# Patient Record
Sex: Female | Born: 1995 | Race: White | Hispanic: No | Marital: Single | Smoking: Never smoker
Health system: Southern US, Community
[De-identification: ages and names within clinical notes are randomized; demographics above are authoritative.]

## PROBLEM LIST (undated history)

## (undated) ENCOUNTER — Inpatient Hospital Stay: Payer: Self-pay

## (undated) DIAGNOSIS — N915 Oligomenorrhea, unspecified: Secondary | ICD-10-CM

## (undated) DIAGNOSIS — L709 Acne, unspecified: Secondary | ICD-10-CM

## (undated) HISTORY — DX: Acne, unspecified: L70.9

## (undated) HISTORY — PX: NO PAST SURGERIES: SHX2092

## (undated) HISTORY — DX: Oligomenorrhea, unspecified: N91.5

---
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
## 2017-03-07 ENCOUNTER — Emergency Department
Admission: EM | Admit: 2017-03-07 | Discharge: 2017-03-07 | Disposition: A | Discharge status: Home / Self Care | Payer: BLUE CROSS/BLUE SHIELD | Attending: Emergency Medicine | Admitting: Emergency Medicine

## 2017-03-07 ENCOUNTER — Encounter: Payer: Self-pay | Admitting: *Deleted

## 2017-03-07 DIAGNOSIS — R197 Diarrhea, unspecified: Secondary | ICD-10-CM

## 2017-03-07 DIAGNOSIS — R112 Nausea with vomiting, unspecified: Secondary | ICD-10-CM

## 2017-03-07 DIAGNOSIS — K529 Noninfective gastroenteritis and colitis, unspecified: Secondary | ICD-10-CM

## 2017-03-07 DIAGNOSIS — E860 Dehydration: Secondary | ICD-10-CM

## 2017-03-07 LAB — COMPREHENSIVE METABOLIC PANEL
ALBUMIN: 4.2 g/dL (ref 3.5–5.0)
ALT: 15 U/L (ref 14–54)
ANION GAP: 8 (ref 5–15)
AST: 23 U/L (ref 15–41)
Alkaline Phosphatase: 46 U/L (ref 38–126)
BILIRUBIN TOTAL: 0.9 mg/dL (ref 0.3–1.2)
BUN: 11 mg/dL (ref 6–20)
CALCIUM: 8.9 mg/dL (ref 8.9–10.3)
CO2: 24 mmol/L (ref 22–32)
CREATININE: 1.18 mg/dL — AB (ref 0.44–1.00)
Chloride: 107 mmol/L (ref 101–111)
GFR calc Af Amer: >60 mL/min (ref >60)
GFR calc non Af Amer: >60 mL/min (ref >60)
GLUCOSE: 116 mg/dL — AB (ref 65–99)
Potassium: 3.5 mmol/L (ref 3.5–5.1)
Sodium: 139 mmol/L (ref 135–145)
TOTAL PROTEIN: 7.3 g/dL (ref 6.5–8.1)

## 2017-03-07 LAB — LIPASE, BLOOD: Lipase: 12 U/L (ref 11–51)

## 2017-03-07 LAB — CBC
HCT: 41.3 % (ref 35.0–47.0)
HEMOGLOBIN: 14.1 g/dL (ref 12.0–16.0)
MCH: 29.6 pg (ref 26.0–34.0)
MCHC: 34.1 g/dL (ref 32.0–36.0)
MCV: 87 fL (ref 80.0–100.0)
PLATELETS: 88 10*3/uL — AB (ref 150–440)
RBC: 4.75 MIL/uL (ref 3.80–5.20)
RDW: 12.2 % (ref 11.5–14.5)
WBC: 7.8 10*3/uL (ref 3.6–11.0)

## 2017-03-07 LAB — URINALYSIS, COMPLETE (UACMP) WITH MICROSCOPIC
BILIRUBIN URINE: NEGATIVE
Glucose, UA: NEGATIVE mg/dL
Ketones, ur: NEGATIVE mg/dL
LEUKOCYTES UA: NEGATIVE
NITRITE: NEGATIVE
PH: 5 (ref 5.0–8.0)
Protein, ur: 100 mg/dL — AB
Specific Gravity, Urine: 1.01 (ref 1.005–1.030)

## 2017-03-07 LAB — POCT PREGNANCY, URINE: Preg Test, Ur: NEGATIVE

## 2017-03-07 MED ORDER — ONDANSETRON 4 MG PO TBDP: 4.0000 mg | ORAL_TABLET | Freq: Three times a day (TID) | ORAL | 0 refills | Status: DC | PRN

## 2017-03-07 MED ORDER — ONDANSETRON 4 MG PO TBDP
8.0000 mg | ORAL_TABLET | Freq: Once | ORAL | Status: AC
  Administered 2017-03-07: 8 mg via ORAL
  Filled 2017-03-07: qty 2

## 2017-03-07 MED ORDER — ONDANSETRON HCL 4 MG/2ML IJ SOLN
INTRAMUSCULAR | Status: AC
  Administered 2017-03-07: 4 mg via INTRAVENOUS
  Filled 2017-03-07: qty 2

## 2017-03-07 MED ORDER — FAMOTIDINE 20 MG PO TABS
20.0000 mg | ORAL_TABLET | Freq: Two times a day (BID) | ORAL | 0 refills | Status: DC
Start: 2017-03-07 — End: 2017-04-06

## 2017-03-07 MED ORDER — DEXTROSE 5 % IV BOLUS
1000.0000 mL | Freq: Once | INTRAVENOUS | Status: AC
  Administered 2017-03-07: 1000 mL via INTRAVENOUS
  Filled 2017-03-07: qty 1000

## 2017-03-07 MED ORDER — ONDANSETRON HCL 4 MG/2ML IJ SOLN
4.0000 mg | Freq: Once | INTRAMUSCULAR | Status: AC
  Administered 2017-03-07: 4 mg via INTRAVENOUS
  Filled 2017-03-07: qty 2

## 2017-03-07 MED ORDER — LOPERAMIDE HCL 2 MG PO TABS: 4.0000 mg | ORAL_TABLET | Freq: Four times a day (QID) | ORAL | 0 refills | Status: DC | PRN

## 2017-03-07 MED ORDER — LOPERAMIDE HCL 2 MG PO CAPS
4.0000 mg | ORAL_CAPSULE | ORAL | Status: DC | PRN
  Filled 2017-03-07: qty 2

## 2017-03-07 MED ORDER — DEXTROSE 5 % AND 0.9 % NACL IV BOLUS
1000.0000 mL | Freq: Once | INTRAVENOUS | Status: DC
  Filled 2017-03-07: qty 1000

## 2017-03-07 MED ORDER — FAMOTIDINE 20 MG PO TABS
40.0000 mg | ORAL_TABLET | Freq: Once | ORAL | Status: AC
  Administered 2017-03-07: 40 mg via ORAL
  Filled 2017-03-07: qty 2

## 2017-03-07 MED ORDER — ONDANSETRON HCL 4 MG/2ML IJ SOLN
4.0000 mg | Freq: Once | INTRAMUSCULAR | Status: AC
  Administered 2017-03-07: 4 mg via INTRAVENOUS

## 2017-03-07 NOTE — ED Notes (Signed)
Patient with additional episodes of vomiting. MD made aware. See MAR for new orders.

## 2017-03-07 NOTE — ED Provider Notes (Signed)
[facility] Emergency Department Provider Note [facility] ____________________________________________  Time seen: Approximately 3:52 PM  I have reviewed the triage vital signs and the nursing notes.   HISTORY  Chief Complaint Emesis    HPI Stephanie Walker is a 21 y.o. female who complains of nausea vomiting and diarrhea for the past 2 days. Difficulty tolerating anything by mouth without vomiting afterward. Denies dizziness or syncope. No blood in her vomiting or stool. Denies abdominal pain.     History reviewed. No pertinent past medical history.   There are no active problems to display for this patient.    History reviewed. No pertinent surgical history.   Prior to Admission medications   Medication Sig Start Date End Date Taking? Authorizing Provider  famotidine (PEPCID) 20 MG tablet Take 1 tablet (20 mg total) by mouth 2 (two) times daily. 03/07/17   Sharman CheekPhillip Jaydenn Boccio, MD  loperamide (IMODIUM A-D) 2 MG tablet Take 2 tablets (4 mg total) by mouth 4 (four) times daily as needed for diarrhea or loose stools. 03/07/17   Sharman CheekPhillip Lillith Mcneff, MD  ondansetron (ZOFRAN ODT) 4 MG disintegrating tablet Take 1 tablet (4 mg total) by mouth every 8 (eight) hours as needed for nausea or vomiting. 03/07/17   Sharman CheekPhillip Leyla Soliz, MD     Allergies Patient has no known allergies.   History reviewed. No pertinent family history.  Social History Social History  Substance Use Topics  . Smoking status: Never Smoker  . Smokeless tobacco: Never Used  . Alcohol use Yes    Review of Systems  Constitutional:   No fever or chills.  ENT:   No sore throat. No rhinorrhea. Cardiovascular:   No chest pain. Respiratory:   No dyspnea or cough. Gastrointestinal:   Positive generalized abdominal pain, mild, with vomiting and diarrhea..  Genitourinary:   Negative for dysuria or difficulty urinating. Musculoskeletal:   Negative for focal pain or swelling Neurological:   Negative for  headaches 10-point ROS otherwise negative.  ____________________________________________   PHYSICAL EXAM:  VITAL SIGNS: ED Triage Vitals  Enc Vitals Group     BP 03/07/17 0904 126/73     Pulse Rate 03/07/17 0904 99     Resp 03/07/17 0904 18     Temp 03/07/17 0904 98.6 F (37 C)     Temp Source 03/07/17 0904 Oral     SpO2 03/07/17 0904 100 %     Weight 03/07/17 0905 132 lb (59.9 kg)     Height 03/07/17 0905 5\' 1"  (1.549 m)     Head Circumference --      Peak Flow --      Pain Score 03/07/17 0904 9     Pain Loc --      Pain Edu? --      Excl. in GC? --     Vital signs reviewed, nursing assessments reviewed.   Constitutional:   Alert and oriented. Well appearing and in no distress. Eyes:   No scleral icterus. No conjunctival pallor. PERRL. EOMI.  No nystagmus. ENT   Head:   Normocephalic and atraumatic.   Nose:   No congestion/rhinnorhea. No septal hematoma   Mouth/Throat:   MMM, Mild pharyngeal erythema. No peritonsillar mass.    Neck:   No stridor. No SubQ emphysema. No meningismus. Hematological/Lymphatic/Immunilogical:   No cervical lymphadenopathy. Cardiovascular:   RRR. Symmetric bilateral radial and DP pulses.  No murmurs.  Respiratory:   Normal respiratory effort without tachypnea nor retractions. Breath sounds are clear and equal bilaterally. No  wheezes/rales/rhonchi. Gastrointestinal:   Soft and nontender. Non distended. There is no CVA tenderness.  No rebound, rigidity, or guarding. Genitourinary:   deferred Musculoskeletal:   Normal range of motion in all extremities. No joint effusions.  No lower extremity tenderness.  No edema. Neurologic:   Normal speech and language.  CN 2-10 normal. Motor grossly intact. No gross focal neurologic deficits are appreciated.  Skin:    Skin is warm, dry and intact. No rash noted.  No petechiae, purpura, or bullae.  ____________________________________________    LABS (pertinent positives/negatives) (all  labs ordered are listed, but only abnormal results are displayed) Labs Reviewed  COMPREHENSIVE METABOLIC PANEL - Abnormal; Notable for the following:       Result Value   Glucose, Bld 116 (*)    Creatinine, Ser 1.18 (*)    All other components within normal limits  CBC - Abnormal; Notable for the following:    Platelets 88 (*)    All other components within normal limits  URINALYSIS, COMPLETE (UACMP) WITH MICROSCOPIC - Abnormal; Notable for the following:    Color, Urine YELLOW (*)    APPearance HAZY (*)    Hgb urine dipstick SMALL (*)    Protein, ur 100 (*)    Bacteria, UA FEW (*)    Squamous Epithelial / LPF 0-5 (*)    All other components within normal limits  LIPASE, BLOOD  POC URINE PREG, ED  POCT PREGNANCY, URINE   ____________________________________________   EKG    ____________________________________________    RADIOLOGY  No results found.  ____________________________________________   PROCEDURES Procedures  ____________________________________________   INITIAL IMPRESSION / ASSESSMENT AND PLAN / ED COURSE  Pertinent labs & imaging results that were available during my care of the patient were reviewed by me and considered in my medical decision making (see chart for details).  Patient well appearing no acute distress. Given oral medications, but still having difficulty tolerating oral intake. Was given IV D5 bolus, feeling better. Tolerating oral intake. We'll discharge home with oral medications for symptom relief. consistent with viral gastroenteritis.Considering the patient's symptoms, medical history, and physical examination today, I have low suspicion for cholecystitis or biliary pathology, pancreatitis, perforation or bowel obstruction, hernia, intra-abdominal abscess, AAA or dissection, volvulus or intussusception, mesenteric ischemia, or appendicitis.            ____________________________________________   FINAL CLINICAL  IMPRESSION(S) / ED DIAGNOSES  Final diagnoses:  Nausea vomiting and diarrhea  Gastroenteritis  Dehydration      New Prescriptions   FAMOTIDINE (PEPCID) 20 MG TABLET    Take 1 tablet (20 mg total) by mouth 2 (two) times daily.   LOPERAMIDE (IMODIUM A-D) 2 MG TABLET    Take 2 tablets (4 mg total) by mouth 4 (four) times daily as needed for diarrhea or loose stools.   ONDANSETRON (ZOFRAN ODT) 4 MG DISINTEGRATING TABLET    Take 1 tablet (4 mg total) by mouth every 8 (eight) hours as needed for nausea or vomiting.     Portions of this note were generated with dragon dictation software. Dictation errors may occur despite best attempts at proofreading.    Sharman Cheek, MD 03/07/17 [phone]

## 2017-03-07 NOTE — ED Triage Notes (Signed)
States she has been vomiting and unable to keep anything down for 36 hours, states abd cramping, pt awake and alert in no acute distress

## 2017-03-07 NOTE — ED Notes (Signed)
Patient is well looking patient, talking and laughing with visitor.  Patient states, "I've been throwing up for the past 36 hours straight."  Patient reports attempting to eat chicken alfredo last night but threw up shortly after.  Patient reports cold sympoms and states, "It's like I"m throwing up green mucous and then that makes me throw up more."  Patient is very talkative.  Denies abdominal pain and diarrhea.  Denies history of similar symptoms.  Patient reports that drinking water at 3am also caused her to have more vomiting episodes.

## 2017-03-09 ENCOUNTER — Ambulatory Visit (INDEPENDENT_AMBULATORY_CARE_PROVIDER_SITE_OTHER): Payer: BLUE CROSS/BLUE SHIELD

## 2017-03-09 ENCOUNTER — Encounter: Payer: Self-pay | Admitting: *Deleted

## 2017-03-09 ENCOUNTER — Ambulatory Visit
Admission: EM | Admit: 2017-03-09 | Discharge: 2017-03-09 | Disposition: A | Discharge status: Home / Self Care | Payer: BLUE CROSS/BLUE SHIELD | Attending: Family Medicine | Admitting: Family Medicine

## 2017-03-09 DIAGNOSIS — R109 Unspecified abdominal pain: Secondary | ICD-10-CM | POA: Diagnosis not present

## 2017-03-09 DIAGNOSIS — R110 Nausea: Secondary | ICD-10-CM | POA: Diagnosis not present

## 2017-03-09 DIAGNOSIS — R112 Nausea with vomiting, unspecified: Secondary | ICD-10-CM

## 2017-03-09 DIAGNOSIS — R509 Fever, unspecified: Secondary | ICD-10-CM | POA: Diagnosis not present

## 2017-03-09 MED ORDER — PROMETHAZINE HCL 25 MG PO TABS: 25.0000 mg | ORAL_TABLET | Freq: Three times a day (TID) | ORAL | 0 refills | Status: DC | PRN

## 2017-03-09 MED ORDER — PROMETHAZINE HCL 25 MG/ML IJ SOLN
25.0000 mg | Freq: Once | INTRAMUSCULAR | Status: AC
  Administered 2017-03-09: 25 mg via INTRAMUSCULAR

## 2017-03-09 NOTE — ED Triage Notes (Signed)
Patient continues have symptoms of nausea and vomiting, abdominal pain, and fever since her visit to the ED on 03/07/17.

## 2017-03-09 NOTE — ED Provider Notes (Signed)
CSN: [id number]     Arrival date & time 03/09/17  1811 History   First MD Initiated Contact with Patient 03/09/17 1904     Chief Complaint  Patient presents with  . Abdominal Pain  . Fever  . Nausea   (Consider location/radiation/quality/duration/timing/severity/associated sxs/prior Treatment) HPI  A 21 year old female who presents with nausea vomiting abdominal pain but no  diarrhea. Her temperature of 99.2. He states that she is able to tolerate liquids but is not able to tolerate any solids. Last BM was normal which happened approximately 2 hours ago. Vomiting at this point is mostly dry heaving. Has no other complaints. Her abdominal pain is very diffuse. She has a negative pregnancy test. I reviewed her  previous laboratories and they all appear normal execpt  her platelets 88 and creatinine  of 1.18.She did not have any imaging at the visit to emergency department 2 days ago. She is in no acute distress does not appear toxic. She states she is very hungry       History reviewed. No pertinent past medical history. History reviewed. No pertinent surgical history. History reviewed. No pertinent family history. Social History  Substance Use Topics  . Smoking status: Never Smoker  . Smokeless tobacco: Never Used  . Alcohol use Yes   OB History    No data available     Review of Systems  Constitutional: Positive for activity change and appetite change. Negative for chills, fatigue and fever.  Gastrointestinal: Positive for abdominal pain, nausea and vomiting.  All other systems reviewed and are negative.   Allergies  Patient has no known allergies.  Home Medications   Prior to Admission medications   Medication Sig Start Date End Date Taking? Authorizing Provider  famotidine (PEPCID) 20 MG tablet Take 1 tablet (20 mg total) by mouth 2 (two) times daily. 03/07/17  Yes Sharman CheekPhillip Stafford, MD  loperamide (IMODIUM A-D) 2 MG tablet Take 2 tablets (4 mg total) by mouth 4 (four)  times daily as needed for diarrhea or loose stools. 03/07/17  Yes Sharman CheekPhillip Stafford, MD  norethindrone-ethinyl estradiol-iron (ESTROSTEP FE,TILIA FE,TRI-LEGEST FE) 1-20/1-30/1-35 MG-MCG tablet Take 1 tablet by mouth daily.   Yes Historical Provider, MD  ondansetron (ZOFRAN ODT) 4 MG disintegrating tablet Take 1 tablet (4 mg total) by mouth every 8 (eight) hours as needed for nausea or vomiting. 03/07/17  Yes Sharman CheekPhillip Stafford, MD  promethazine (PHENERGAN) 25 MG tablet Take 1 tablet (25 mg total) by mouth every 8 (eight) hours as needed for nausea or vomiting. 03/09/17   Lutricia FeilWilliam P Roemer, PA-C   Meds Ordered and Administered this Visit   Medications  promethazine (PHENERGAN) injection 25 mg (25 mg Intramuscular Given 03/09/17 2041)    BP 127/80 (BP Location: Left Arm)   Pulse 83   Temp 99.2 F (37.3 C) (Oral)   Resp 16   LMP 02/21/2017 (Approximate)   SpO2 100%  No data found.   Physical Exam  Constitutional: She is oriented to person, place, and time. She appears well-developed and well-nourished. No distress.  HENT:  Head: Normocephalic and atraumatic.  Eyes: Pupils are equal, round, and reactive to light. Right eye exhibits no discharge. Left eye exhibits no discharge.  Neck: Normal range of motion. Neck supple.  Pulmonary/Chest: Effort normal and breath sounds normal.  Abdominal: Soft. Bowel sounds are normal. She exhibits no distension and no mass. There is tenderness. There is no rebound and no guarding.  Abdominal pain is diffuse appears to be more prevalent on  the left lower quadrant. She is a negative Murphy sign.  Musculoskeletal: Normal range of motion.  Neurological: She is alert and oriented to person, place, and time.  Skin: Skin is warm and dry. She is not diaphoretic.  Psychiatric: She has a normal mood and affect. Her behavior is normal. Judgment and thought content normal.  Nursing note and vitals reviewed.   Urgent Care Course     Procedures (including critical  care time)  Labs Review Labs Reviewed - No data to display  Imaging Review Dg Abd Acute W/chest  Result Date: 03/09/2017 CLINICAL DATA:  Acute onset of left lower quadrant abdominal pain, nausea, vomiting and fever. Initial encounter. EXAM: DG ABDOMEN ACUTE W/ 1V CHEST COMPARISON:  None. FINDINGS: The lungs are well-aerated and clear. There is no evidence of focal opacification, pleural effusion or pneumothorax. The cardiomediastinal silhouette is within normal limits. The visualized bowel gas pattern is unremarkable. Scattered stool and air are seen within the colon; there is no evidence of small bowel dilatation to suggest obstruction. No free intra-abdominal air is identified on the provided upright view. No acute osseous abnormalities are seen; the sacroiliac joints are unremarkable in appearance. IMPRESSION: 1. Unremarkable bowel gas pattern; no free intra-abdominal air seen. Small amount of stool noted in the colon. 2. No acute cardiopulmonary process seen. Electronically Signed   By: Roanna Raider M.D.   On: 03/09/2017 20:13     Visual Acuity Review  Right Eye Distance:   Left Eye Distance:   Bilateral Distance:    Right Eye Near:   Left Eye Near:    Bilateral Near:      Medications  promethazine (PHENERGAN) injection 25 mg (25 mg Intramuscular Given 03/09/17 2041)     MDM   1. Nausea   2. Non-intractable vomiting with nausea, unspecified vomiting type    New Prescriptions   PROMETHAZINE (PHENERGAN) 25 MG TABLET    Take 1 tablet (25 mg total) by mouth every 8 (eight) hours as needed for nausea or vomiting.  Plan: 1. Test/x-ray results and diagnosis reviewed with patient 2. rx as per orders; risks, benefits, potential side effects reviewed with patient 3. Recommend supportive treatment with Fluid replacement. Try Phenergan for better nausea control since Zofran does not seem to be helping. She is to follow-up with her primary care physician may require additional imaging  either ultrasound and/or CAT scan. She worsens overnight she should go back to the emergency room. Hopefully this will eventually wane;Stop The nausea vomiting and  improve. If not, she will need further evaluation and treatment 4. F/u prn if symptoms worsen or don't improve     Lutricia Feil, PA-C 03/09/17 2044    William P [place], [place] 03/09/17 2045

## 2017-04-06 ENCOUNTER — Encounter: Payer: Self-pay | Admitting: Certified Nurse Midwife

## 2017-04-06 ENCOUNTER — Ambulatory Visit (INDEPENDENT_AMBULATORY_CARE_PROVIDER_SITE_OTHER): Payer: BLUE CROSS/BLUE SHIELD | Admitting: Certified Nurse Midwife

## 2017-04-06 VITALS — BP 100/60 | HR 90 | Ht 62.0 in | Wt 130.0 lb

## 2017-04-06 DIAGNOSIS — Z308 Encounter for other contraceptive management: Secondary | ICD-10-CM | POA: Diagnosis not present

## 2017-04-09 NOTE — Progress Notes (Signed)
   Obstetrics & Gynecology Office Visit   Chief Complaint:  Wants to discuss changing birth control.  History of Present Illness: 21 year old G0 who started taking Sprintec birth control pills last June after her Nexplanon was removed. She is having a hard time remembering to take her pills and they also make her nauseous at times.  She also has a history of acne and oligomenorrhea. She does not smoke. She has no chronic medical illnesses. Her LMP was 01/12/2017   Review of Systems:  ROS   Past Medical History:  Past Medical History:  Diagnosis Date  . Acne   . Oligomenorrhea     Past Surgical History:  Past Surgical History:  Procedure Laterality Date  . NO PAST SURGERIES      Gynecologic History: Patient's last menstrual period was 01/12/2017 (approximate).  Obstetric History: G0P0000  Family History:  Family History  Problem Relation Age of Onset  . Hypertension Mother     Social History:  Social History   Social History  . Marital status: Single    Spouse name: N/A  . Number of children: N/A  . Years of education: N/A   Occupational History  . Not on file.   Social History Main Topics  . Smoking status: Never Smoker  . Smokeless tobacco: Never Used  . Alcohol use Yes  . Drug use: No  . Sexual activity: Yes    Birth control/ protection: Pill   Other Topics Concern  . Not on file   Social History Narrative  . No narrative on file    Allergies:  No Known Allergies  Medications: Prior to Admission medications   Medication Sig Start Date End Date Taking? Authorizing Provider  SPRINTEC 28 0.25-35 MG-MCG tablet Take 1 tablet by mouth daily. 01/27/17  Yes Historical Provider, MD    Physical Exam Vitals: BP 100/60   Pulse 90   Ht  (1.575 m)   Wt 59 kg (130 lb)   LMP 01/12/2017 (Approximate) Comment: continuos dose OCP  BMI 23.78 kg/m  Patient's last menstrual period was 01/12/2017 (approximate).  Physical Exam  Constitutional: She is  oriented to person, place, and time. She appears well-developed and well-nourished. No distress.  HENT:  Head: Normocephalic and atraumatic.  Neurological: She is alert and oriented to person, place, and time.  Skin: Skin is warm and dry.  Psychiatric: She has a normal mood and affect. Her behavior is normal. Thought content normal.     Assessment: 21 y.o. G0P0000 for birth control conference  Plan: Problem List Items Addressed This Visit    None    Visit Diagnoses    Encounter for other contraceptive management    -  Primary     Discussed all contraceptive options and reviewed pros and cons of each. Desires to try Nuvaring. Discussed how to insert/use possible side effects, 3 hour rule, and risks of thromboembolism as with birth control pills. Sample given as well as RX.Follow up at time of annual in June/July or sooner prn problems.  Farrel Conners, CNM

## 2017-04-10 MED ORDER — ETONOGESTREL-ETHINYL ESTRADIOL 0.12-0.015 MG/24HR VA RING: VAGINAL_RING | VAGINAL | 3 refills | Status: DC

## 2017-04-17 ENCOUNTER — Other Ambulatory Visit: Payer: Self-pay | Admitting: Certified Nurse Midwife

## 2017-08-22 NOTE — Progress Notes (Deleted)
Gynecology Annual Exam  PCP: Mickey Farber, MD  Chief Complaint: No chief complaint on file.   History of Present Illness:Stephanie Walker is a 21 year old Caucasian/White female , G 0 P 0 0 0 0 , who presents for her annual exam and removal of her Implanon that was placed 04/11/2013 . She is having no significant GYN problems. Would like to start OCPs.  Her menses were absent on the Nexplanon/Implanon until last month. She had a 4 day menses ending on 1June. due to Nexplanon.   She denies dysmenorrhea.  The patient's past medical history is notable for a history of acne.  Since her last annual GYN exam dated 07/18/2014, she has had no significant changes in her health history.She has transferred to [employer] and is now studying elementary ed and special ed.  She is sexually active. She is currently using Nexplanon/Implanon for contraception.   Mammogram is not applicable.  There is no family history of breast cancer.  There is no family history of ovarian cancer.  The patient does not do monthly self breast exams.  The patient does not smoke.  The patient does not drink alcohol.  The patient does not use illegal drugs.  The patient exercises occasionally by walking.  The patient does get adequate calcium in her diet.  She has not had a recent cholesterol screen and is not interested in labwork.     The patient {Blank single:19197::"reports","denies"} current symptoms of depression.    Review of Systems: Review of Systems  Constitutional: Negative for chills, fever and weight loss.  HENT: Negative for congestion, sinus pain and sore throat.   Eyes: Negative for blurred vision and pain.  Respiratory: Negative for hemoptysis, shortness of breath and wheezing.   Cardiovascular: Negative for chest pain, palpitations and leg swelling.  Gastrointestinal: Negative for abdominal pain, blood in stool, diarrhea, heartburn, nausea and vomiting.  Genitourinary: Negative for dysuria,  frequency, hematuria and urgency.  Musculoskeletal: Negative for back pain, joint pain and myalgias.  Skin: Negative for itching and rash.  Neurological: Negative for dizziness, tingling and headaches.  Endo/Heme/Allergies: Negative for environmental allergies and polydipsia. Does not bruise/bleed easily.       Negative for hirsutism   Psychiatric/Behavioral: Negative for depression. The patient is not nervous/anxious and does not have insomnia.     Past Medical History:  Past Medical History:  Diagnosis Date  . Acne   . Oligomenorrhea     Past Surgical History:  Past Surgical History:  Procedure Laterality Date  . NO PAST SURGERIES      Family History:  Family History  Problem Relation Age of Onset  . Hypertension Mother     Social History:  Social History   Social History  . Marital status: Single    Spouse name: N/A  . Number of children: N/A  . Years of education: N/A   Occupational History  . Not on file.   Social History Main Topics  . Smoking status: Never Smoker  . Smokeless tobacco: Never Used  . Alcohol use Yes  . Drug use: No  . Sexual activity: Yes    Birth control/ protection: Pill   Other Topics Concern  . Not on file   Social History Narrative  . No narrative on file    Allergies:  No Known Allergies  Medications: Prior to Admission medications   Medication Sig Start Date End Date Taking? Authorizing Provider  etonogestrel-ethinyl estradiol (NUVARING) 0.12-0.015 MG/24HR vaginal ring Insert  vaginally and leave in place for 3 consecutive weeks, then remove for 1 week. 04/06/17   Farrel ConnersGutierrez, Tamarcus Condie, CNM    Physical Exam Vitals: There were no vitals taken for this visit.  General: NAD HEENT: normocephalic, anicteric Neck: no thyroid enlargement, no palpable nodules, no cervical lymphadenopathy  Pulmonary: No increased work of breathing, CTAB Cardiovascular: RRR, {Blank single:19197::"with murmur","without murmur"}  Breast: Breast  symmetrical, no tenderness, no palpable nodules or masses, no skin or nipple retraction present, no nipple discharge.  No axillary, infraclavicular or supraclavicular lymphadenopathy. Abdomen: Soft, non-tender, non-distended.  Umbilicus without lesions.  No hepatomegaly or masses palpable. No evidence of hernia. Genitourinary:  External: Normal external female genitalia.  Normal urethral meatus, normal  Bartholin's and Skene's glands.    Vagina: Normal vaginal mucosa, no evidence of prolapse.    Cervix: Grossly normal in appearance, no bleeding, non-tender  Uterus: Anteverted, normal size, shape, and consistency, mobile, and non-tender  Adnexa: No adnexal masses, non-tender  Rectal: deferred  Lymphatic: no evidence of inguinal lymphadenopathy Extremities: no edema, erythema, or tenderness Neurologic: Grossly intact Psychiatric: mood appropriate, affect full     Assessment: 21 y.o. G0P0000 No problem-specific Assessment & Plan notes found for this encounter.   Plan:  ***  1) Breast cancer screening - recommend monthly self breast exam. {Blank single:19197::" ","Mammogram is up to date.","Mammogram was ordered today."}  2) STI screening was offered and {Blank single:19197::"accepted","declined"}.  3) Cervical cancer screening - {Blank single:19197::"Pap smear due in *** years","Pap not indicated","Pap was done"}. ASCCP guidelines and rational discussed.  Patient opts for {Blank single:19197::"every 5 years","every 3 years","yearly"} screening interval  4) Contraception - Education given regarding options for contraception  5) Routine healthcare maintenance including cholesterol and diabetes screening {Blank single:19197::"declined","managed by PCP","ordered today"}

## 2017-08-23 ENCOUNTER — Ambulatory Visit: Payer: BLUE CROSS/BLUE SHIELD | Admitting: Certified Nurse Midwife

## 2017-08-31 IMAGING — CR DG ABDOMEN ACUTE W/ 1V CHEST
5 series · 5 of 5 positions shown · non-contrast
Comparison: None.

CLINICAL DATA: Acute onset of left lower quadrant abdominal pain,
nausea, vomiting and fever. Initial encounter.

EXAM:
DG ABDOMEN ACUTE W/ 1V CHEST

[chest pa]
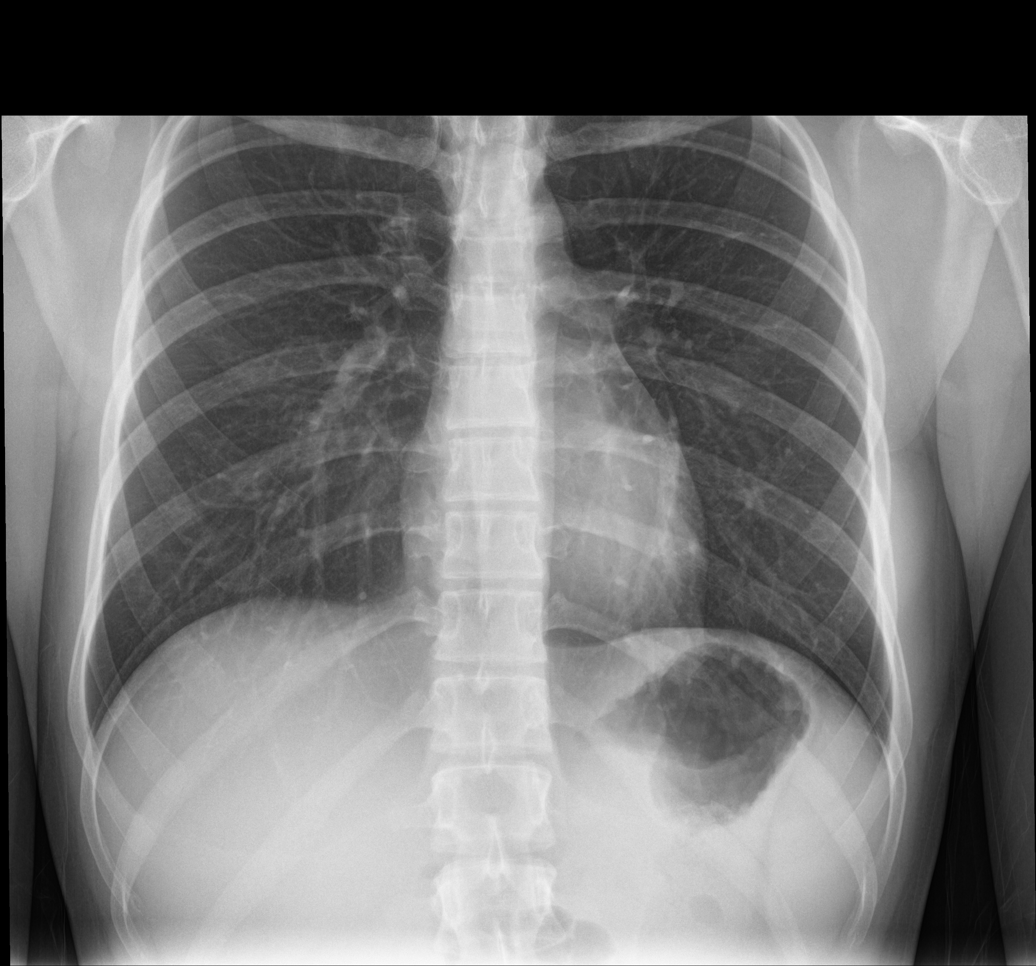

[abdomen supine (1 of 2)]
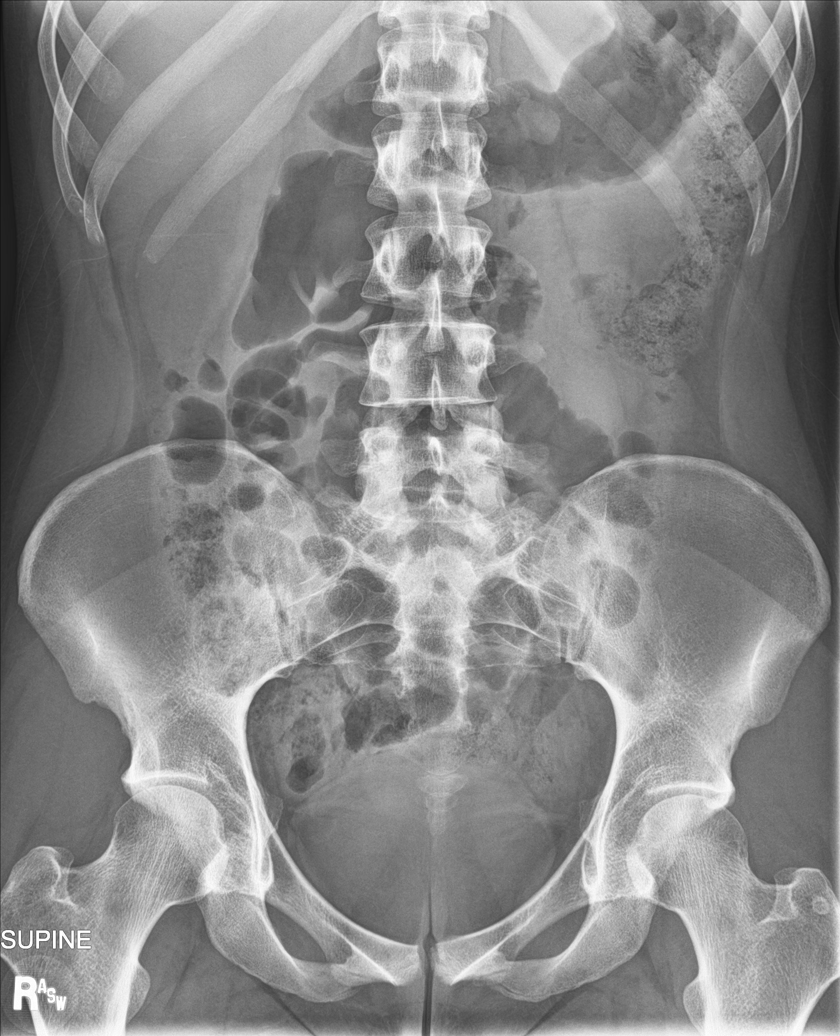

[abdomen supine (2 of 2)]
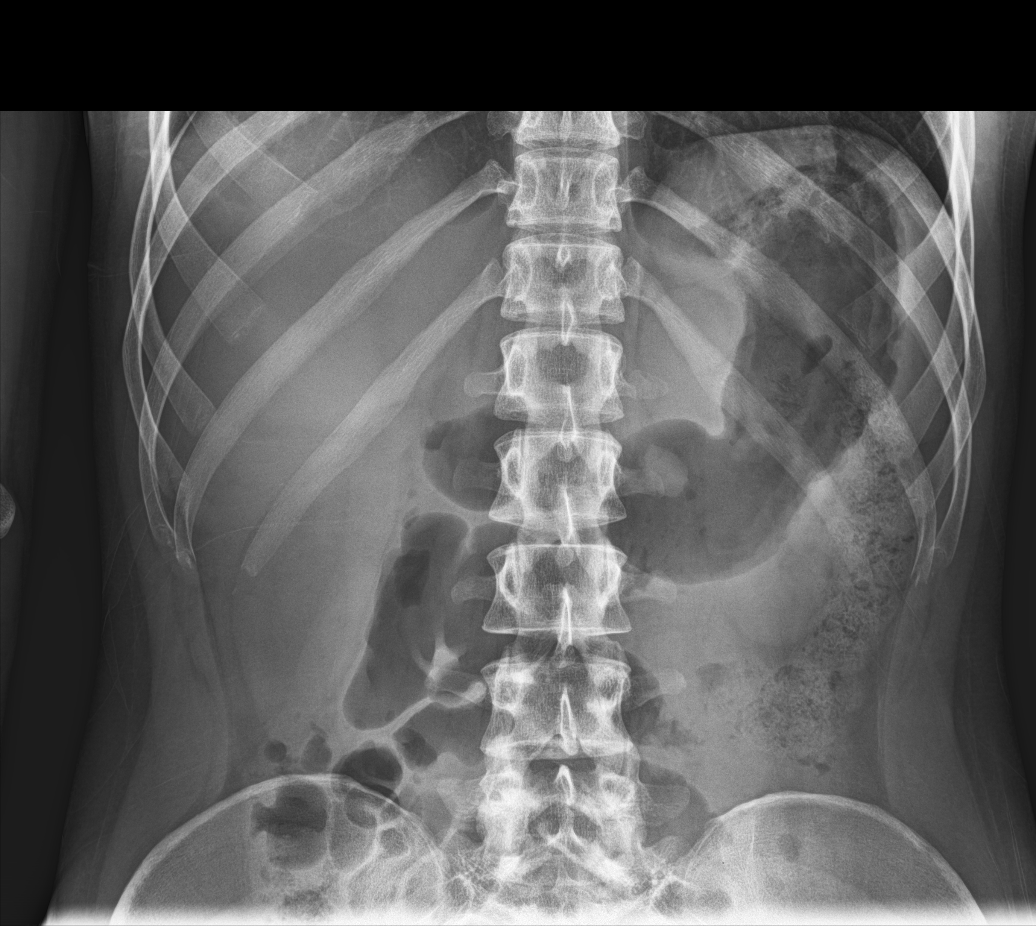

[abdomen erect (1 of 2)]
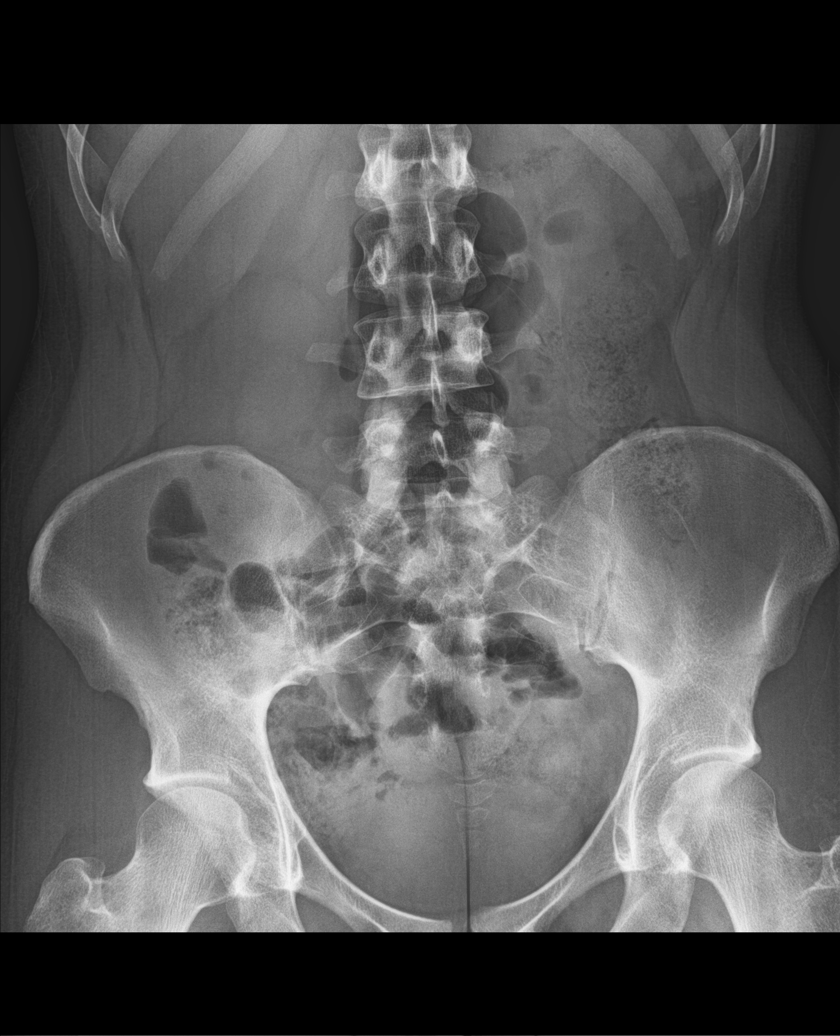

[abdomen erect (2 of 2)]
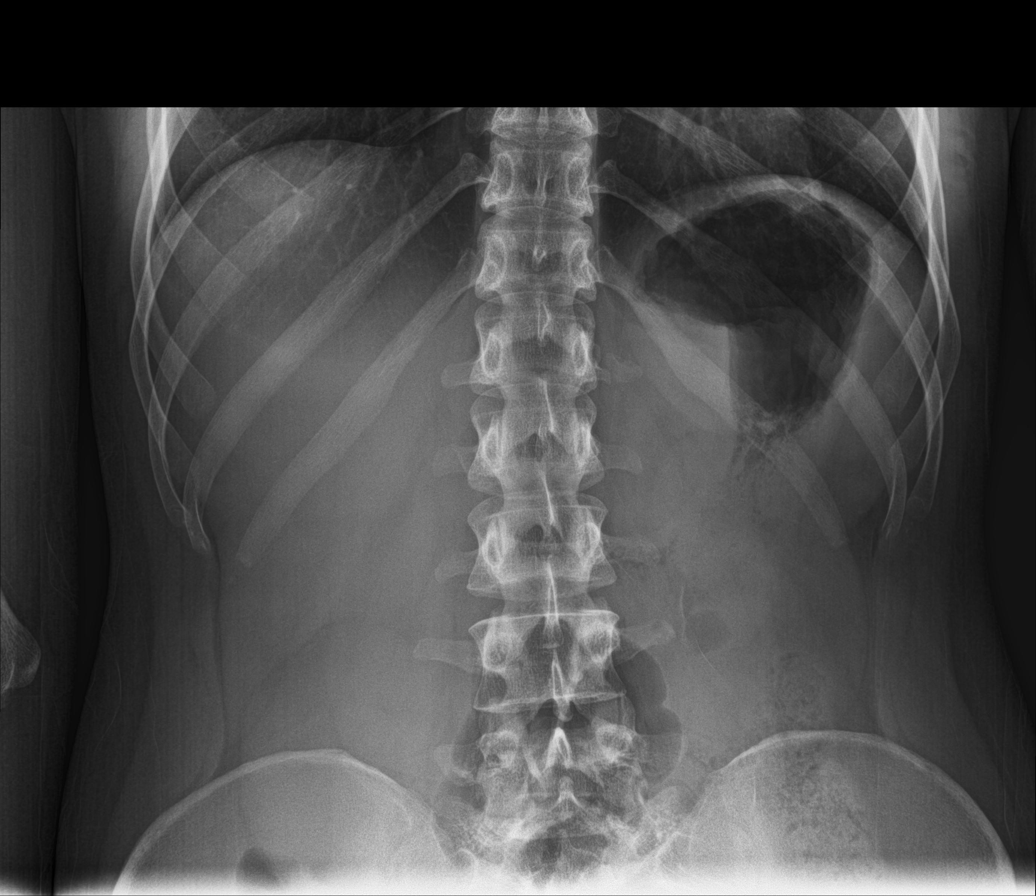

[5 of 5 positions shown; findings below may reference images not displayed]

FINDINGS: The lungs are well-aerated and clear. There is no evidence of focal
opacification, pleural effusion or pneumothorax. The
cardiomediastinal silhouette is within normal limits.

The visualized bowel gas pattern is unremarkable. Scattered stool
and air are seen within the colon; there is no evidence of small
bowel dilatation to suggest obstruction. No free intra-abdominal air
is identified on the provided upright view.

No acute osseous abnormalities are seen; the sacroiliac joints are
unremarkable in appearance.
IMPRESSION: 1. Unremarkable bowel gas pattern; no free intra-abdominal air seen.
Small amount of stool noted in the colon.
2. No acute cardiopulmonary process seen.

## 2017-10-20 ENCOUNTER — Ambulatory Visit (INDEPENDENT_AMBULATORY_CARE_PROVIDER_SITE_OTHER): Payer: Self-pay | Admitting: Certified Nurse Midwife

## 2017-10-20 ENCOUNTER — Encounter: Payer: Self-pay | Admitting: Certified Nurse Midwife

## 2017-10-20 VITALS — BP 106/61 | HR 105 | Ht 62.0 in | Wt 132.6 lb

## 2017-10-20 DIAGNOSIS — N912 Amenorrhea, unspecified: Secondary | ICD-10-CM

## 2017-10-20 DIAGNOSIS — N926 Irregular menstruation, unspecified: Secondary | ICD-10-CM

## 2017-10-20 LAB — POCT URINE PREGNANCY: PREG TEST UR: POSITIVE — AB

## 2017-10-20 NOTE — Progress Notes (Signed)
GYN ENCOUNTER NOTE  Subjective:       Stephanie Walker is a 21 y.o. [id number] female here for pregnancy confirmation. Accompanied by FOB and cousin.  Reports three (3) positive home pregnancy test. Endorses intermittent lower abdominal cramping.  Denies difficulty breathing or respiratory distress, chest pain, abdominal pain, vaginal bleeding, and leg pain or swelling.   Pregnancy was unplanned, but not unwanted. Patient is taking OTC PNV.   History significant for irregular periods.     Gynecologic History  Patient's last menstrual period was 09/12/2017 (approximate).   Estimated date of birth: 06/19/2019.  Gestational age: 14 weeks and 3 days  Contraception: NuvaRing vaginal inserts  Last Pap: due.   Obstetric History  OB History  Gravida Para Term Preterm AB Living  1 0 0 0 0 0  SAB TAB Ectopic Multiple Live Births  0 0 0 0 0    # Outcome Date GA Lbr Len/2nd Weight Sex Delivery Anes PTL Lv  1 Current               Past Medical History:  Diagnosis Date  . Acne   . Oligomenorrhea     Past Surgical History:  Procedure Laterality Date  . NO PAST SURGERIES     No Known Allergies  Social History   Socioeconomic History  . Marital status: Single    Spouse name: Not on file  . Number of children: Not on file  . Years of education: Not on file  . Highest education level: Not on file  Social Needs  . Financial resource strain: Not on file  . Food insecurity - worry: Not on file  . Food insecurity - inability: Not on file  . Transportation needs - medical: Not on file  . Transportation needs - non-medical: Not on file  Occupational History  . Not on file  Tobacco Use  . Smoking status: Never Smoker  . Smokeless tobacco: Never Used  Substance and Sexual Activity  . Alcohol use: Yes    Comment: weekly   . Drug use: No  . Sexual activity: Yes    Birth control/protection: None  Other Topics Concern  . Not on file  Social History Narrative  . Not on file     Family History  Problem Relation Age of Onset  . Hypertension Mother   . Breast cancer Neg Hx   . Ovarian cancer Neg Hx   . Colon cancer Neg Hx   . Diabetes Neg Hx     Review of Systems  Review of Systems - Negative except as noted above.  History obtained from the patient.   Objective:   BP 106/61   Pulse (!) 105   Ht 5\' 2"  (1.575 m)   Wt 132 lb 9.6 oz (60.1 kg)   LMP 09/12/2017 (Approximate)   BMI 24.25 kg/m    CONSTITUTIONAL: Well-developed, well-nourished female in no acute distress.   HENT:  Normocephalic, atraumatic.   NECK: Normal range of motion, supple, no masses.     SKIN: Skin is warm and dry. No rash noted. Not diaphoretic.  No erythema. No pallor.  NEUROLGIC: Alert and oriented to person, place, and time.   PSYCHIATRIC: Normal mood and affect. Normal behavior. Normal judgment and thought content.  MUSCULOSKELETAL: Normal range of motion. No tenderness.  No cyanosis, clubbing, or edema.  Positive UPT  Assessment:   1. Amenorrhea  - POCT urine pregnancy  2. Irregular menses  - US OB Comp Less 14 Wks; Future -  US OB Transvaginal; Future  Plan:   Handouts given: pregnancy diet, safe pregnancy medications, first trimester notes, and morning sickness.  Reviewed red flag symptoms and when to call.   RTC x 1-2 weeks for dating/viability scan.   RTC x 4 weeks for nurse intake.   RTC x 7 weeks for NOB Physical.    Gunnar BullaJenkins Michelle Nikky Duba, CNM   A total of 20 minutes were spent face-to-face with the patient during the encounter with greater than 50% dealing with counseling and coordination of care.

## 2017-10-20 NOTE — Patient Instructions (Signed)
WHAT OB PATIENTS CAN EXPECT   Confirmation of pregnancy and ultrasound ordered if medically indicated-[redacted] weeks gestation  New OB (NOB) intake with nurse and New OB (NOB) labs- [redacted] weeks gestation  New OB (NOB) physical examination with provider- 11/[redacted] weeks gestation  Flu vaccine-[redacted] weeks gestation  Anatomy scan-[redacted] weeks gestation  Glucose tolerance test, blood work to test for anemia, T-dap vaccine-[redacted] weeks gestation  Vaginal swabs/cultures-STD/Group B strep-[redacted] weeks gestation  Appointments every 4 weeks until 28 weeks  Every 2 weeks from 28 weeks until 36 weeks  Weekly visits from 36 weeks until delivery  Common Medications Safe in Pregnancy  Acne:      Constipation:  Benzoyl Peroxide     Colace  Clindamycin      Dulcolax Suppository  Topica Erythromycin     Fibercon  Salicylic Acid      Metamucil         Miralax AVOID:        Senakot   Accutane    Cough:  Retin-A       Cough Drops  Tetracycline      Phenergan w/ Codeine if Rx  Minocycline      Robitussin (Plain & DM)  Antibiotics:     Crabs/Lice:  Ceclor       RID  Cephalosporins    AVOID:  E-Mycins      Kwell  Keflex  Macrobid/Macrodantin   Diarrhea:  Penicillin      Kao-Pectate  Zithromax      Imodium AD         PUSH FLUIDS AVOID:       Cipro     Fever:  Tetracycline      Tylenol (Regular or Extra  Minocycline       Strength)  Levaquin      Extra Strength-Do not          Exceed 8 tabs/24 hrs Caffeine:        <253m/day (equiv. To 1 cup of coffee or  approx. 3 12 oz sodas)         Gas: Cold/Hayfever:       Gas-X  Benadryl      Mylicon  Claritin       Phazyme  **Claritin-D        Chlor-Trimeton    Headaches:  Dimetapp      ASA-Free Excedrin  Drixoral-Non-Drowsy     Cold Compress  Mucinex (Guaifenasin)     Tylenol (Regular or Extra  Sudafed/Sudafed-12 Hour     Strength)  **Sudafed PE Pseudoephedrine   Tylenol Cold & Sinus     Vicks Vapor Rub  Zyrtec  **AVOID if Problems With Blood  Pressure         Heartburn: Avoid lying down for at least 1 hour after meals  Aciphex      Maalox     Rash:  Milk of Magnesia     Benadryl    Mylanta       1% Hydrocortisone Cream  Pepcid  Pepcid Complete   Sleep Aids:  Prevacid      Ambien   Prilosec       Benadryl  Rolaids       Chamomile Tea  Tums (Limit 4/day)     Unisom  Zantac       Tylenol PM         Warm milk-add vanilla or  Hemorrhoids:       Sugar for taste  Anusol/Anusol H.C.  (RX: Analapram 2.5%)  Sugar Substitutes:  Hydrocortisone OTC     Ok in moderation  Preparation H      Tucks        Vaseline lotion applied to tissue with wiping    Herpes:     Throat:  Acyclovir      Oragel  Famvir  Valtrex     Vaccines:         Flu Shot Leg Cramps:       *Gardasil  Benadryl      Hepatitis A         Hepatitis B Nasal Spray:       Pneumovax  Saline Nasal Spray     Polio Booster         Tetanus Nausea:       Tuberculosis test or PPD  Vitamin B6 25 mg TID   AVOID:    Dramamine      *Gardasil  Emetrol       Live Poliovirus  Ginger Root 250 mg QID    MMR (measles, mumps &  High Complex Carbs @ Bedtime    rebella)  Sea Bands-Accupressure    Varicella (Chickenpox)  Unisom 1/2 tab TID     *No known complications           If received before Pain:         Known pregnancy;   Darvocet       Resume series after  Lortab        Delivery  Percocet    Yeast:   Tramadol      Femstat  Tylenol 3      Gyne-lotrimin  Ultram       Monistat  Vicodin           MISC:         All Sunscreens           Hair Coloring/highlights          Insect Repellant's          (Including DEET)         Mystic Tans Morning Sickness Morning sickness is when you feel sick to your stomach (nauseous) during pregnancy. You may feel sick to your stomach and throw up (vomit). You may feel sick in the morning, but you can feel this way any time of day. Some women feel very sick to their stomach and cannot stop throwing up (hyperemesis  gravidarum). Follow these instructions at home:  Only take medicines as told by your doctor.  Take multivitamins as told by your doctor. Taking multivitamins before getting pregnant can stop or lessen the harshness of morning sickness.  Eat dry toast or unsalted crackers before getting out of bed.  Eat 5 to 6 small meals a day.  Eat dry and bland foods like rice and baked potatoes.  Do not drink liquids with meals. Drink between meals.  Do not eat greasy, fatty, or spicy foods.  Have someone cook for you if the smell of food causes you to feel sick or throw up.  If you feel sick to your stomach after taking prenatal vitamins, take them at night or with a snack.  Eat protein when you need a snack (nuts, yogurt, cheese).  Eat unsweetened gelatins for dessert.  Wear a bracelet used for sea sickness (acupressure wristband).  Go to a doctor that puts thin needles into certain body points (acupuncture) to improve how you feel.  Do not smoke.  Use a humidifier to keep the air in  your house free of odors.  Get lots of fresh air. Contact a doctor if:  You need medicine to feel better.  You feel dizzy or lightheaded.  You are losing weight. Get help right away if:  You feel very sick to your stomach and cannot stop throwing up.  You pass out (faint). This information is not intended to replace advice given to you by your health care provider. Make sure you discuss any questions you have with your health care provider. Document Released: 01/05/2005 Document Revised: 05/05/2016 Document Reviewed: 05/15/2013 Elsevier Interactive Patient Education  2017 [facility] While you are pregnant, your body will require additional nutrition to help support your growing baby. It is recommended that you consume:  150 additional calories each day during your first trimester.  300 additional calories each day during your second trimester.  300 additional  calories each day during your third trimester.  Eating a healthy, well-balanced diet is very important for your health and for your baby's health. You also have a higher need for some vitamins and minerals, such as folic acid, calcium, iron, and vitamin D. What do I need to know about eating during pregnancy?  Do not try to lose weight or go on a diet during pregnancy.  Choose healthy, nutritious foods. Choose  of a sandwich with a glass of milk instead of a candy bar or a high-calorie sugar-sweetened beverage.  Limit your overall intake of foods that have "empty calories." These are foods that have little nutritional value, such as sweets, desserts, candies, sugar-sweetened beverages, and fried foods.  Eat a variety of foods, especially fruits and vegetables.  Take a prenatal vitamin to help meet the additional needs during pregnancy, specifically for folic acid, iron, calcium, and vitamin D.  Remember to stay active. Ask your health care provider for exercise recommendations that are specific to you.  Practice good food safety and cleanliness, such as washing your hands before you eat and after you prepare raw meat. This helps to prevent foodborne illnesses, such as listeriosis, that can be very dangerous for your baby. Ask your health care provider for more information about listeriosis. What does 150 extra calories look like? Healthy options for an additional 150 calories each day could be any of the following:  Plain low-fat yogurt (6-8 oz) with  cup of berries.  1 apple with 2 teaspoons of peanut butter.  Cut-up vegetables with  cup of hummus.  Low-fat chocolate milk (8 oz or 1 cup).  1 string cheese with 1 medium orange.   of a peanut butter and jelly sandwich on whole-wheat bread (1 tsp of peanut butter).  For 300 calories, you could eat two of those healthy options each day. What is a healthy amount of weight to gain? The recommended amount of weight for you to gain is  based on your pre-pregnancy BMI. If your pre-pregnancy BMI was:  Less than 18 (underweight), you should gain 28-40 lb.  18-24.9 (normal), you should gain 25-35 lb.  25-29.9 (overweight), you should gain 15-25 lb.  Greater than 30 (obese), you should gain 11-20 lb.  What if I am having twins or multiples? Generally, pregnant women who will be having twins or multiples may need to increase their daily calories by 300-600 calories each day. The recommended range for total weight gain is 25-54 lb, depending on your pre-pregnancy BMI. Talk with your health care provider for specific guidance about additional nutritional needs, weight gain, and exercise  during your pregnancy. What foods can I eat? Grains Any grains. Try to choose whole grains, such as whole-wheat bread, oatmeal, or brown rice. Vegetables Any vegetables. Try to eat a variety of colors and types of vegetables to get a full range of vitamins and minerals. Remember to wash your vegetables well before eating. Fruits Any fruits. Try to eat a variety of colors and types of fruit to get a full range of vitamins and minerals. Remember to wash your fruits well before eating. Meats and Other Protein Sources Lean meats, including chicken, [place], fish, and lean cuts of beef, veal, or pork. Make sure that all meats are cooked to "well done." Tofu. Tempeh. Beans. Eggs. Peanut butter and other nut butters. Seafood, such as shrimp, crab, and lobster. If you choose fish, select types that are higher in omega-3 fatty acids, including salmon, herring, mussels, trout, sardines, and pollock. Make sure that all meats are cooked to food-safe temperatures. Dairy Pasteurized milk and milk alternatives. Pasteurized yogurt and pasteurized cheese. Cottage cheese. Sour cream. Beverages Water. Juices that contain 100% fruit juice or vegetable juice. Caffeine-free teas and decaffeinated coffee. Drinks that contain caffeine are okay to drink, but it is better to  avoid caffeine. Keep your total caffeine intake to less than 200 mg each day (12 oz of coffee, tea, or soda) or as directed by your health care provider. Condiments Any pasteurized condiments. Sweets and Desserts Any sweets and desserts. Fats and Oils Any fats and oils. The items listed above may not be a complete list of recommended foods or beverages. Contact your dietitian for more options. What foods are not recommended? Vegetables Unpasteurized (raw) vegetable juices. Fruits Unpasteurized (raw) fruit juices. Meats and Other Protein Sources Cured meats that have nitrates, such as bacon, salami, and hotdogs. Luncheon meats, bologna, or other deli meats (unless they are reheated until they are steaming hot). Refrigerated pate, meat spreads from a meat counter, smoked seafood that is found in the refrigerated section of a store. Raw fish, such as sushi or sashimi. High mercury content fish, such as tilefish, shark, swordfish, and king mackerel. Raw meats, such as tuna or beef tartare. Undercooked meats and poultry. Make sure that all meats are cooked to food-safe temperatures. Dairy Unpasteurized (raw) milk and any foods that have raw milk in them. Soft cheeses, such as feta, queso blanco, queso fresco, Brie, Camembert cheeses, blue-veined cheeses, and Panela cheese (unless it is made with pasteurized milk, which must be stated on the label). Beverages Alcohol. Sugar-sweetened beverages, such as sodas, teas, or energy drinks. Condiments Homemade fermented foods and drinks, such as pickles, sauerkraut, or kombucha drinks. (Store-bought pasteurized versions of these are okay.) Other Salads that are made in the store, such as ham salad, chicken salad, egg salad, tuna salad, and seafood salad. The items listed above may not be a complete list of foods and beverages to avoid. Contact your dietitian for more information. This information is not intended to replace advice given to you by your  health care provider. Make sure you discuss any questions you have with your health care provider. Document Released: 09/12/2014 Document Revised: 05/05/2016 Document Reviewed: 05/13/2014 Elsevier Interactive Patient Education  2018 [place] of Pregnancy The first trimester of pregnancy is from week 1 until the end of week 13 (months 1 through 3). During this time, your baby will begin to develop inside you. At 6-8 weeks, the eyes and face are formed, and the heartbeat can be seen on ultrasound.  At the end of 12 weeks, all the baby's organs are formed. Prenatal care is all the medical care you receive before the birth of your baby. Make sure you get good prenatal care and follow all of your doctor's instructions. Follow these instructions at home: Medicines  Take over-the-counter and prescription medicines only as told by your doctor. Some medicines are safe and some medicines are not safe during pregnancy.  Take a prenatal vitamin that contains at least 600 micrograms (mcg) of folic acid.  If you have trouble pooping (constipation), take medicine that will make your stool soft (stool softener) if your doctor approves. Eating and drinking  Eat regular, healthy meals.  Your doctor will tell you the amount of weight gain that is right for you.  Avoid raw meat and uncooked cheese.  If you feel sick to your stomach (nauseous) or throw up (vomit): ? Eat 4 or 5 small meals a day instead of 3 large meals. ? Try eating a few soda crackers. ? Drink liquids between meals instead of during meals.  To prevent constipation: ? Eat foods that are high in fiber, like fresh fruits and vegetables, whole grains, and beans. ? Drink enough fluids to keep your pee (urine) clear or pale yellow. Activity  Exercise only as told by your doctor. Stop exercising if you have cramps or pain in your lower belly (abdomen) or low back.  Do not exercise if it is too hot, too humid, or if you are  in a place of great height (high altitude).  Try to avoid standing for long periods of time. Move your legs often if you must stand in one place for a long time.  Avoid heavy lifting.  Wear low-heeled shoes. Sit and stand up straight.  You can have sex unless your doctor tells you not to. Relieving pain and discomfort  Wear a good support bra if your breasts are sore.  Take warm water baths (sitz baths) to soothe pain or discomfort caused by hemorrhoids. Use hemorrhoid cream if your doctor says it is okay.  Rest with your legs raised if you have leg cramps or low back pain.  If you have puffy, bulging veins (varicose veins) in your legs: ? Wear support hose or compression stockings as told by your doctor. ? Raise (elevate) your feet for 15 minutes, 3-4 times a day. ? Limit salt in your food. Prenatal care  Schedule your prenatal visits by the twelfth week of pregnancy.  Write down your questions. Take them to your prenatal visits.  Keep all your prenatal visits as told by your doctor. This is important. Safety  Wear your seat belt at all times when driving.  Make a list of emergency phone numbers. The list should include numbers for family, friends, the hospital, and police and fire departments. General instructions  Ask your doctor for a referral to a local prenatal class. Begin classes no later than at the start of month 6 of your pregnancy.  Ask for help if you need counseling or if you need help with nutrition. Your doctor can give you advice or tell you where to go for help.  Do not use hot tubs, steam rooms, or saunas.  Do not douche or use tampons or scented sanitary pads.  Do not cross your legs for long periods of time.  Avoid all herbs and alcohol. Avoid drugs that are not approved by your doctor.  Do not use any tobacco products, including cigarettes, chewing tobacco, and electronic  cigarettes. If you need help quitting, ask your doctor. You may get  counseling or other support to help you quit.  Avoid cat litter boxes and soil used by cats. These carry germs that can cause birth defects in the baby and can cause a loss of your baby (miscarriage) or stillbirth.  Visit your dentist. At home, brush your teeth with a soft toothbrush. Be gentle when you floss. Contact a doctor if:  You are dizzy.  You have mild cramps or pressure in your lower belly.  You have a nagging pain in your belly area.  You continue to feel sick to your stomach, you throw up, or you have watery poop (diarrhea).  You have a bad smelling fluid coming from your vagina.  You have pain when you pee (urinate).  You have increased puffiness (swelling) in your face, hands, legs, or ankles. Get help right away if:  You have a fever.  You are leaking fluid from your vagina.  You have spotting or bleeding from your vagina.  You have very bad belly cramping or pain.  You gain or lose weight rapidly.  You throw up blood. It may look like coffee grounds.  You are around people who have [place] measles, fifth disease, or chickenpox.  You have a very bad headache.  You have shortness of breath.  You have any kind of trauma, such as from a fall or a car accident. Summary  The first trimester of pregnancy is from week 1 until the end of week 13 (months 1 through 3).  To take care of yourself and your unborn baby, you will need to eat healthy meals, take medicines only if your doctor tells you to do so, and do activities that are safe for you and your baby.  Keep all follow-up visits as told by your doctor. This is important as your doctor will have to ensure that your baby is healthy and growing well. This information is not intended to replace advice given to you by your health care provider. Make sure you discuss any questions you have with your health care provider. Document Released: 05/16/2008 Document Revised: 12/06/2016 Document Reviewed:  12/06/2016 Elsevier Interactive Patient Education  2017 [employer].

## 2017-10-30 ENCOUNTER — Ambulatory Visit (INDEPENDENT_AMBULATORY_CARE_PROVIDER_SITE_OTHER): Payer: BLUE CROSS/BLUE SHIELD

## 2017-10-30 DIAGNOSIS — N926 Irregular menstruation, unspecified: Secondary | ICD-10-CM

## 2017-11-06 ENCOUNTER — Encounter: Payer: BLUE CROSS/BLUE SHIELD | Admitting: Obstetrics and Gynecology

## 2017-11-13 ENCOUNTER — Encounter: Payer: Self-pay | Admitting: Certified Nurse Midwife

## 2017-11-17 ENCOUNTER — Other Ambulatory Visit: Payer: Self-pay

## 2017-11-17 ENCOUNTER — Ambulatory Visit: Payer: BLUE CROSS/BLUE SHIELD | Admitting: Certified Nurse Midwife

## 2017-11-17 VITALS — BP 107/68 | HR 114 | Ht 62.0 in | Wt 132.5 lb

## 2017-11-17 DIAGNOSIS — Z3401 Encounter for supervision of normal first pregnancy, first trimester: Secondary | ICD-10-CM

## 2017-11-17 DIAGNOSIS — Z1389 Encounter for screening for other disorder: Secondary | ICD-10-CM

## 2017-11-17 DIAGNOSIS — Z113 Encounter for screening for infections with a predominantly sexual mode of transmission: Secondary | ICD-10-CM

## 2017-11-17 LAB — OB RESULTS CONSOLE VARICELLA ZOSTER ANTIBODY, IGG: VARICELLA IGG: IMMUNE

## 2017-11-17 NOTE — Progress Notes (Signed)
Stephanie ChristmasBrandy Walker presents for NOB nurse interview visit. Pregnancy confirmation done on 10/20/2017, UPT: positive. Seen by JML,CNM.  LMP: 09/12/2017. Ultrasound on 10/30/2017 resulted, EGA: 6.3 wks which is consistent with LMP.  G-1.  P-0000. Pregnancy education material explained and given. No cats in the home. NOB labs ordered.  HIV labs and Drug screen were explained optional and she did not decline. Drug screen ordered. PNV encouraged. Genetic screening options discussed. Genetic testing: Unsure.  Pt may discuss with provider. Pt. To follow up with provider on 12/08/2017 as scheduled for NOB physical.  All questions answered.

## 2017-11-17 NOTE — Patient Instructions (Signed)
Pregnancy and Zika Virus Disease Zika virus disease, or Zika, is an illness that can spread to people from mosquitoes that carry the virus. It may also spread from person to person through infected body fluids. Zika first occurred in [place], but recently it has spread to new areas. The virus occurs in tropical climates. The location of Zika continues to change. Most people who become infected with Zika virus do not develop serious illness. However, Zika may cause birth defects in an unborn baby whose mother is infected with the virus. It may also increase the risk of miscarriage. What are the symptoms of Zika virus disease? In many cases, people who have been infected with Zika virus do not develop any symptoms. If symptoms appear, they usually start about a week after the person is infected. Symptoms are usually mild. They may include:  Fever.  Rash.  Red eyes.  Joint pain.  How does Zika virus disease spread? The main way that Zika virus spreads is through the bite of a certain type of mosquito. Unlike most types of mosquitos, which bite only at night, the type of mosquito that carries Zika virus bites both at night and during the day. Zika virus can also spread through sexual contact, through a blood transfusion, and from a mother to her baby before or during birth. Once you have had Zika virus disease, it is unlikely that you will get it again. Can I pass Zika to my baby during pregnancy? Yes, Zika can pass from a mother to her baby before or during birth. What problems can Zika cause for my baby? A woman who is infected with Zika virus while pregnant is at risk of having her baby born with a condition in which the brain or head is smaller than expected (microcephaly). Babies who have microcephaly can have developmental delays, seizures, hearing problems, and vision problems. Having Zika virus disease during pregnancy can also increase the risk of miscarriage. How can Zika virus disease be  prevented? There is no vaccine to prevent Zika. The best way to prevent the disease is to avoid infected mosquitoes and avoid exposure to body fluids that can spread the virus. Avoid any possible exposure to Zika by taking the following precautions. For women and their sex partners:  Avoid traveling to high-risk areas. The locations where Zika is being reported change often. To identify high-risk areas, check the CDC travel website: www.cdc.gov/zika/geo/index.html  If you or your sex partner must travel to a high-risk area, talk with a health care provider before and after traveling.  Take all precautions to avoid mosquito bites if you live in, or travel to, any of the high-risk areas. Insect repellents are safe to use during pregnancy.  Ask your health care provider when it is safe to have sexual contact.  For women:  If you are pregnant or trying to become pregnant, avoid sexual contact with persons who may have been exposed to Zika virus, persons who have possible symptoms of Zika, or persons whose history you are unsure about. If you choose to have sexual contact with someone who may have been exposed to Zika virus, use condoms correctly during the entire duration of sexual activity, every time. Do not share sexual devices, as you may be exposed to body fluids.  Ask your health care provider about when it is safe to attempt pregnancy after a possible exposure to Zika virus.  What steps should I take to avoid mosquito bites? Take these steps to avoid mosquito bites   when you are in a high-risk area:  Wear loose clothing that covers your arms and legs.  Limit your outdoor activities.  Do not open windows unless they have window screens.  Sleep under mosquito nets.  Use insect repellent. The best insect repellents have:  DEET, picaridin, oil of lemon eucalyptus (OLE), or IR3535 in them.  Higher amounts of an active ingredient in them.  Remember that insect repellents are safe to  use during pregnancy.  Do not use OLE on children who are younger than 3 years of age. Do not use insect repellent on babies who are younger than 2 months of age.  Cover your child's stroller with mosquito netting. Make sure the netting fits snugly and that any loose netting does not cover your child's mouth or nose. Do not use a blanket as a mosquito-protection cover.  Do not apply insect repellent underneath clothing.  If you are using sunscreen, apply the sunscreen before applying the insect repellent.  Treat clothing with permethrin. Do not apply permethrin directly to your skin. Follow label directions for safe use.  Get rid of standing water, where mosquitoes may reproduce. Standing water is often found in items such as buckets, bowls, animal food dishes, and flowerpots.  When you return from traveling to any high-risk area, continue taking actions to protect yourself against mosquito bites for 3 weeks, even if you show no signs of illness. This will prevent spreading Zika virus to uninfected mosquitoes. What should I know about the sexual transmission of Zika? People can spread Zika to their sexual partners during vaginal, anal, or oral sex, or by sharing sexual devices. Many people with Zika do not develop symptoms, so a person could spread the disease without knowing that they are infected. The greatest risk is to women who are pregnant or who may become pregnant. Zika virus can live longer in semen than it can live in blood. Couples can prevent sexual transmission of the virus by:  Using condoms correctly during the entire duration of sexual activity, every time. This includes vaginal, anal, and oral sex.  Not sharing sexual devices. Sharing increases your risk of being exposed to body fluid from another person.  Avoiding all sexual activity until your health care provider says it is safe.  Should I be tested for Zika virus? A sample of your blood can be tested for Zika virus. A  pregnant woman should be tested if she may have been exposed to the virus or if she has symptoms of Zika. She may also have additional tests done during her pregnancy, such ultrasound testing. Talk with your health care provider about which tests are recommended. This information is not intended to replace advice given to you by your health care provider. Make sure you discuss any questions you have with your health care provider. Document Released: 08/19/2015 Document Revised: 05/05/2016 Document Reviewed: 08/12/2015 Elsevier Interactive Patient Education  2018 [employer] Hyperemesis Gravidarum Hyperemesis gravidarum is a severe form of nausea and vomiting that happens during pregnancy. Hyperemesis is worse than morning sickness. It may cause you to have nausea or vomiting all day for many days. It may keep you from eating and drinking enough food and liquids. Hyperemesis usually occurs during the first half (the first 20 weeks) of pregnancy. It often goes away once a woman is in her second half of pregnancy. However, sometimes hyperemesis continues through an entire pregnancy. What are the causes? The cause of this condition is not known. It may be related   to changes in chemicals (hormones) in the body during pregnancy, such as the high level of pregnancy hormone (human chorionic gonadotropin) or the increase in the female sex hormone (estrogen). What are the signs or symptoms? Symptoms of this condition include:  Severe nausea and vomiting.  Nausea that does not go away.  Vomiting that does not allow you to keep any food down.  Weight loss.  Body fluid loss (dehydration).  Having no desire to eat, or not liking food that you have previously enjoyed.  How is this diagnosed? This condition may be diagnosed based on:  A physical exam.  Your medical history.  Your symptoms.  Blood tests.  Urine tests.  How is this treated? This condition may be managed with medicine. If  medicines to do not help relieve nausea and vomiting, you may need to receive fluids through an IV tube at the hospital. Follow these instructions at home:  Take over-the-counter and prescription medicines only as told by your health care provider.  Avoid iron pills and multivitamins that contain iron for the first 3-4 months of pregnancy. If you take prescription iron pills, do not stop taking them unless your health care provider approves.  Take the following actions to help prevent nausea and vomiting: ? In the morning, before getting out of bed, try eating a couple of dry crackers or a piece of toast. ? Avoid foods and smells that upset your stomach. Fatty and spicy foods may make nausea worse. ? Eat 5-6 small meals a day. ? Do not drink fluids while eating meals. Drink between meals. ? Eat or suck on things that have ginger in them. Ginger can help relieve nausea. ? Avoid food preparation. The smell of food can spoil your appetite or trigger nausea.  Follow instructions from your health care provider about eating or drinking restrictions.  For snacks, eat high-protein foods, such as cheese.  Keep all follow-up and pre-birth (prenatal) visits as told by your health care provider. This is important. Contact a health care provider if:  You have pain in your abdomen.  You have a severe headache.  You have vision problems.  You are losing weight. Get help right away if:  You cannot drink fluids without vomiting.  You vomit blood.  You have constant nausea and vomiting.  You are very weak.  You are very thirsty.  You feel dizzy.  You faint.  You have a fever or other symptoms that last for more than 2-3 days.  You have a fever and your symptoms suddenly get worse. Summary  Hyperemesis gravidarum is a severe form of nausea and vomiting that happens during pregnancy.  Making some changes to your eating habits may help relieve nausea and vomiting.  This condition may  be managed with medicine.  If medicines to do not help relieve nausea and vomiting, you may need to receive fluids through an IV tube at the hospital. This information is not intended to replace advice given to you by your health care provider. Make sure you discuss any questions you have with your health care provider. Document Released: 11/28/2005 Document Revised: 07/27/2016 Document Reviewed: 07/27/2016 Elsevier Interactive Patient Education  2017 [employer] First Trimester of Pregnancy The first trimester of pregnancy is from week 1 until the end of week 13 (months 1 through 3). During this time, your baby will begin to develop inside you. At 6-8 weeks, the eyes and face are formed, and the heartbeat can be seen on ultrasound. At the   end of 12 weeks, all the baby's organs are formed. Prenatal care is all the medical care you receive before the birth of your baby. Make sure you get good prenatal care and follow all of your doctor's instructions. Follow these instructions at home: Medicines  Take over-the-counter and prescription medicines only as told by your doctor. Some medicines are safe and some medicines are not safe during pregnancy.  Take a prenatal vitamin that contains at least 600 micrograms (mcg) of folic acid.  If you have trouble pooping (constipation), take medicine that will make your stool soft (stool softener) if your doctor approves. Eating and drinking  Eat regular, healthy meals.  Your doctor will tell you the amount of weight gain that is right for you.  Avoid raw meat and uncooked cheese.  If you feel sick to your stomach (nauseous) or throw up (vomit): ? Eat 4 or 5 small meals a day instead of 3 large meals. ? Try eating a few soda crackers. ? Drink liquids between meals instead of during meals.  To prevent constipation: ? Eat foods that are high in fiber, like fresh fruits and vegetables, whole grains, and beans. ? Drink enough fluids to keep your pee  (urine) clear or pale yellow. Activity  Exercise only as told by your doctor. Stop exercising if you have cramps or pain in your lower belly (abdomen) or low back.  Do not exercise if it is too hot, too humid, or if you are in a place of great height (high altitude).  Try to avoid standing for long periods of time. Move your legs often if you must stand in one place for a long time.  Avoid heavy lifting.  Wear low-heeled shoes. Sit and stand up straight.  You can have sex unless your doctor tells you not to. Relieving pain and discomfort  Wear a good support bra if your breasts are sore.  Take warm water baths (sitz baths) to soothe pain or discomfort caused by hemorrhoids. Use hemorrhoid cream if your doctor says it is okay.  Rest with your legs raised if you have leg cramps or low back pain.  If you have puffy, bulging veins (varicose veins) in your legs: ? Wear support hose or compression stockings as told by your doctor. ? Raise (elevate) your feet for 15 minutes, 3-4 times a day. ? Limit salt in your food. Prenatal care  Schedule your prenatal visits by the twelfth week of pregnancy.  Write down your questions. Take them to your prenatal visits.  Keep all your prenatal visits as told by your doctor. This is important. Safety  Wear your seat belt at all times when driving.  Make a list of emergency phone numbers. The list should include numbers for family, friends, the hospital, and police and fire departments. General instructions  Ask your doctor for a referral to a local prenatal class. Begin classes no later than at the start of month 6 of your pregnancy.  Ask for help if you need counseling or if you need help with nutrition. Your doctor can give you advice or tell you where to go for help.  Do not use hot tubs, steam rooms, or saunas.  Do not douche or use tampons or scented sanitary pads.  Do not cross your legs for long periods of time.  Avoid all herbs  and alcohol. Avoid drugs that are not approved by your doctor.  Do not use any tobacco products, including cigarettes, chewing tobacco, and electronic cigarettes.   If you need help quitting, ask your doctor. You may get counseling or other support to help you quit.  Avoid cat litter boxes and soil used by cats. These carry germs that can cause birth defects in the baby and can cause a loss of your baby (miscarriage) or stillbirth.  Visit your dentist. At home, brush your teeth with a soft toothbrush. Be gentle when you floss. Contact a doctor if:  You are dizzy.  You have mild cramps or pressure in your lower belly.  You have a nagging pain in your belly area.  You continue to feel sick to your stomach, you throw up, or you have watery poop (diarrhea).  You have a bad smelling fluid coming from your vagina.  You have pain when you pee (urinate).  You have increased puffiness (swelling) in your face, hands, legs, or ankles. Get help right away if:  You have a fever.  You are leaking fluid from your vagina.  You have spotting or bleeding from your vagina.  You have very bad belly cramping or pain.  You gain or lose weight rapidly.  You throw up blood. It may look like coffee grounds.  You are around people who have German measles, fifth disease, or chickenpox.  You have a very bad headache.  You have shortness of breath.  You have any kind of trauma, such as from a fall or a car accident. Summary  The first trimester of pregnancy is from week 1 until the end of week 13 (months 1 through 3).  To take care of yourself and your unborn baby, you will need to eat healthy meals, take medicines only if your doctor tells you to do so, and do activities that are safe for you and your baby.  Keep all follow-up visits as told by your doctor. This is important as your doctor will have to ensure that your baby is healthy and growing well. This information is not intended to replace  advice given to you by your health care provider. Make sure you discuss any questions you have with your health care provider. Document Released: 05/16/2008 Document Revised: 12/06/2016 Document Reviewed: 12/06/2016 Elsevier Interactive Patient Education  2017 [employer] Commonly Asked Questions During Pregnancy  Cats: A parasite can be excreted in cat feces.  To avoid exposure you need to have another person empty the little box.  If you must empty the litter box you will need to wear gloves.  Wash your hands after handling your cat.  This parasite can also be found in raw or undercooked meat so this should also be avoided.  Colds, Sore Throats, Flu: Please check your medication sheet to see what you can take for symptoms.  If your symptoms are unrelieved by these medications please call the office.  Dental Work: Most any dental work your dentist recommends is permitted.  X-rays should only be taken during the first trimester if absolutely necessary.  Your abdomen should be shielded with a lead apron during all x-rays.  Please notify your provider prior to receiving any x-rays.  Novocaine is fine; gas is not recommended.  If your dentist requires a note from us prior to dental work please call the office and we will provide one for you.  Exercise: Exercise is an important part of staying healthy during your pregnancy.  You may continue most exercises you were accustomed to prior to pregnancy.  Later in your pregnancy you will most likely notice you have difficulty with activities   requiring balance like riding a bicycle.  It is important that you listen to your body and avoid activities that put you at a higher risk of falling.  Adequate rest and staying well hydrated are a must!  If you have questions about the safety of specific activities ask your provider.    Exposure to Children with illness: Try to avoid obvious exposure; report any symptoms to us when noted,  If you have chicken pos, red  measles or mumps, you should be immune to these diseases.   Please do not take any vaccines while pregnant unless you have checked with your OB provider.  Fetal Movement: After 28 weeks we recommend you do "kick counts" twice daily.  Lie or sit down in a calm quiet environment and count your baby movements "kicks".  You should feel your baby at least 10 times per hour.  If you have not felt 10 kicks within the first hour get up, walk around and have something sweet to eat or drink then repeat for an additional hour.  If count remains less than 10 per hour notify your provider.  Fumigating: Follow your pest control agent's advice as to how long to stay out of your home.  Ventilate the area well before re-entering.  Hemorrhoids:   Most over-the-counter preparations can be used during pregnancy.  Check your medication to see what is safe to use.  It is important to use a stool softener or fiber in your diet and to drink lots of liquids.  If hemorrhoids seem to be getting worse please call the office.   Hot Tubs:  Hot tubs Jacuzzis and saunas are not recommended while pregnant.  These increase your internal body temperature and should be avoided.  Intercourse:  Sexual intercourse is safe during pregnancy as long as you are comfortable, unless otherwise advised by your provider.  Spotting may occur after intercourse; report any bright red bleeding that is heavier than spotting.  Labor:  If you know that you are in labor, please go to the hospital.  If you are unsure, please call the office and let us help you decide what to do.  Lifting, straining, etc:  If your job requires heavy lifting or straining please check with your provider for any limitations.  Generally, you should not lift items heavier than that you can lift simply with your hands and arms (no back muscles)  Painting:  Paint fumes do not harm your pregnancy, but may make you ill and should be avoided if possible.  Latex or water based paints  have less odor than oils.  Use adequate ventilation while painting.  Permanents & Hair Color:  Chemicals in hair dyes are not recommended as they cause increase hair dryness which can increase hair loss during pregnancy.  " Highlighting" and permanents are allowed.  Dye may be absorbed differently and permanents may not hold as well during pregnancy.  Sunbathing:  Use a sunscreen, as skin burns easily during pregnancy.  Drink plenty of fluids; avoid over heating.  Tanning Beds:  Because their possible side effects are still unknown, tanning beds are not recommended.  Ultrasound Scans:  Routine ultrasounds are performed at approximately 20 weeks.  You will be able to see your baby's general anatomy an if you would like to know the gender this can usually be determined as well.  If it is questionable when you conceived you may also receive an ultrasound early in your pregnancy for dating purposes.  Otherwise ultrasound exams   are not routinely performed unless there is a medical necessity.  Although you can request a scan we ask that you pay for it when conducted because insurance does not cover " patient request" scans.  Work: If your pregnancy proceeds without complications you may work until your due date, unless your physician or employer advises otherwise.  Round Ligament Pain/Pelvic Discomfort:  Sharp, shooting pains not associated with bleeding are fairly common, usually occurring in the second trimester of pregnancy.  They tend to be worse when standing up or when you remain standing for long periods of time.  These are the result of pressure of certain pelvic ligaments called "round ligaments".  Rest, Tylenol and heat seem to be the most effective relief.  As the womb and fetus grow, they rise out of the pelvis and the discomfort improves.  Please notify the office if your pain seems different than that described.  It may represent a more serious condition.   

## 2017-11-17 NOTE — Progress Notes (Signed)
I have reviewed the record and concur with patient management and plan.    Keyah Blizard Michelle Laquanda Bick, CNM [facility] 

## 2017-11-18 LAB — URINALYSIS, ROUTINE W REFLEX MICROSCOPIC
BILIRUBIN UA: NEGATIVE
GLUCOSE, UA: NEGATIVE
KETONES UA: NEGATIVE
Nitrite, UA: POSITIVE — AB
RBC UA: NEGATIVE
Urobilinogen, Ur: 0.2 mg/dL (ref 0.2–1.0)
pH, UA: 5.5 (ref 5.0–7.5)

## 2017-11-18 LAB — CBC WITH DIFFERENTIAL/PLATELET
Basophils Absolute: 0 10*3/uL (ref 0.0–0.2)
Basos: 0 %
EOS (ABSOLUTE): 0.1 10*3/uL (ref 0.0–0.4)
EOS: 1 %
HEMOGLOBIN: 14.1 g/dL (ref 11.1–15.9)
Hematocrit: 40.7 % (ref 34.0–46.6)
IMMATURE GRANULOCYTES: 0 %
Immature Grans (Abs): 0 10*3/uL (ref 0.0–0.1)
LYMPHS ABS: 1.8 10*3/uL (ref 0.7–3.1)
LYMPHS: 20 %
MCH: 30.3 pg (ref 26.6–33.0)
MCHC: 34.6 g/dL (ref 31.5–35.7)
MCV: 88 fL (ref 79–97)
MONOCYTES: 7 %
Monocytes Absolute: 0.6 10*3/uL (ref 0.1–0.9)
Neutrophils Absolute: 6.2 10*3/uL (ref 1.4–7.0)
Neutrophils: 72 %
Platelets: 152 10*3/uL (ref 150–379)
RBC: 4.65 x10E6/uL (ref 3.77–5.28)
RDW: 13 % (ref 12.3–15.4)
WBC: 8.7 10*3/uL (ref 3.4–10.8)

## 2017-11-18 LAB — RPR: RPR: NONREACTIVE

## 2017-11-18 LAB — HEPATITIS B SURFACE ANTIGEN: HEP B S AG: NEGATIVE

## 2017-11-18 LAB — MICROSCOPIC EXAMINATION
Casts: NONE SEEN /lpf
Epithelial Cells (non renal): >10 /hpf — AB (ref 0–10)
RBC, UA: NONE SEEN /hpf (ref 0–?)

## 2017-11-18 LAB — RH TYPE: Rh Factor: POSITIVE

## 2017-11-18 LAB — HIV ANTIBODY (ROUTINE TESTING W REFLEX): HIV Screen 4th Generation wRfx: NONREACTIVE

## 2017-11-18 LAB — ABO

## 2017-11-18 LAB — ANTIBODY SCREEN: Antibody Screen: NEGATIVE

## 2017-11-18 LAB — RUBELLA SCREEN

## 2017-11-18 LAB — VARICELLA ZOSTER ANTIBODY, IGG: Varicella zoster IgG: 167 index (ref >165)

## 2017-11-21 ENCOUNTER — Encounter: Payer: Self-pay | Admitting: Certified Nurse Midwife

## 2017-11-21 DIAGNOSIS — O9989 Other specified diseases and conditions complicating pregnancy, childbirth and the puerperium: Secondary | ICD-10-CM

## 2017-11-21 DIAGNOSIS — Z283 Underimmunization status: Secondary | ICD-10-CM | POA: Insufficient documentation

## 2017-11-21 DIAGNOSIS — O09899 Supervision of other high risk pregnancies, unspecified trimester: Secondary | ICD-10-CM | POA: Insufficient documentation

## 2017-11-21 DIAGNOSIS — Z2839 Other underimmunization status: Secondary | ICD-10-CM | POA: Insufficient documentation

## 2017-11-21 LAB — URINE CULTURE, OB REFLEX

## 2017-11-21 LAB — CULTURE, OB URINE

## 2017-11-21 LAB — GC/CHLAMYDIA PROBE AMP
Chlamydia trachomatis, NAA: NEGATIVE
Neisseria gonorrhoeae by PCR: NEGATIVE

## 2017-11-25 ENCOUNTER — Encounter: Payer: Self-pay | Admitting: Certified Nurse Midwife

## 2017-11-28 LAB — MONITOR DRUG PROFILE 14(MW)

## 2017-11-28 LAB — NICOTINE SCREEN, URINE

## 2017-12-08 ENCOUNTER — Ambulatory Visit (INDEPENDENT_AMBULATORY_CARE_PROVIDER_SITE_OTHER): Payer: BLUE CROSS/BLUE SHIELD | Admitting: Certified Nurse Midwife

## 2017-12-08 VITALS — BP 121/59 | HR 79 | Wt 132.5 lb

## 2017-12-08 DIAGNOSIS — O9989 Other specified diseases and conditions complicating pregnancy, childbirth and the puerperium: Secondary | ICD-10-CM

## 2017-12-08 DIAGNOSIS — Z2839 Other underimmunization status: Secondary | ICD-10-CM

## 2017-12-08 DIAGNOSIS — Z3401 Encounter for supervision of normal first pregnancy, first trimester: Secondary | ICD-10-CM

## 2017-12-08 DIAGNOSIS — O09899 Supervision of other high risk pregnancies, unspecified trimester: Secondary | ICD-10-CM

## 2017-12-08 DIAGNOSIS — Z283 Underimmunization status: Secondary | ICD-10-CM

## 2017-12-08 LAB — POCT URINALYSIS DIPSTICK
BILIRUBIN UA: NEGATIVE
Glucose, UA: NEGATIVE
KETONES UA: NEGATIVE
Leukocytes, UA: NEGATIVE
NITRITE UA: NEGATIVE
PH UA: 6 (ref 5.0–8.0)
PROTEIN UA: NEGATIVE
RBC UA: NEGATIVE
Spec Grav, UA: 1.02 (ref 1.010–1.025)
UROBILINOGEN UA: 0.2 U/dL

## 2017-12-08 NOTE — Progress Notes (Signed)
Pt is here for a new OB physical. Denies N/V.

## 2017-12-08 NOTE — Patient Instructions (Addendum)
Common Medications Safe in Pregnancy  Acne:      Constipation:  Benzoyl Peroxide     Colace  Clindamycin      Dulcolax Suppository  Topica Erythromycin     Fibercon  Salicylic Acid      Metamucil         Miralax AVOID:        Senakot   Accutane    Cough:  Retin-A       Cough Drops  Tetracycline      Phenergan w/ Codeine if Rx  Minocycline      Robitussin (Plain & DM)  Antibiotics:     Crabs/Lice:  Ceclor       RID  Cephalosporins    AVOID:  E-Mycins      Kwell  Keflex  Macrobid/Macrodantin   Diarrhea:  Penicillin      Kao-Pectate  Zithromax      Imodium AD         PUSH FLUIDS AVOID:       Cipro     Fever:  Tetracycline      Tylenol (Regular or Extra  Minocycline       Strength)  Levaquin      Extra Strength-Do not          Exceed 8 tabs/24 hrs Caffeine:        <200mg/day (equiv. To 1 cup of coffee or  approx. 3 12 oz sodas)         Gas: Cold/Hayfever:       Gas-X  Benadryl      Mylicon  Claritin       Phazyme  **Claritin-D        Chlor-Trimeton    Headaches:  Dimetapp      ASA-Free Excedrin  Drixoral-Non-Drowsy     Cold Compress  Mucinex (Guaifenasin)     Tylenol (Regular or Extra  Sudafed/Sudafed-12 Hour     Strength)  **Sudafed PE Pseudoephedrine   Tylenol Cold & Sinus     Vicks Vapor Rub  Zyrtec  **AVOID if Problems With Blood Pressure         Heartburn: Avoid lying down for at least 1 hour after meals  Aciphex      Maalox     Rash:  Milk of Magnesia     Benadryl    Mylanta       1% Hydrocortisone Cream  Pepcid  Pepcid Complete   Sleep Aids:  Prevacid      Ambien   Prilosec       Benadryl  Rolaids       Chamomile Tea  Tums (Limit 4/day)     Unisom  Zantac       Tylenol PM         Warm milk-add vanilla or  Hemorrhoids:       Sugar for taste  Anusol/Anusol H.C.  (RX: Analapram 2.5%)  Sugar Substitutes:  Hydrocortisone OTC     Ok in moderation  Preparation H      Tucks        Vaseline lotion applied to tissue with  wiping    Herpes:     Throat:  Acyclovir      Oragel  Famvir  Valtrex     Vaccines:         Flu Shot Leg Cramps:       *Gardasil  Benadryl      Hepatitis A         Hepatitis B Nasal Spray:         Pneumovax  Saline Nasal Spray     Polio Booster         Tetanus Nausea:       Tuberculosis test or PPD  Vitamin B6 25 mg TID   AVOID:    Dramamine      *Gardasil  Emetrol       Live Poliovirus  Ginger Root 250 mg QID    MMR (measles, mumps &  High Complex Carbs @ Bedtime    rebella)  Sea Bands-Accupressure    Varicella (Chickenpox)  Unisom 1/2 tab TID     *No known complications           If received before Pain:         Known pregnancy;   Darvocet       Resume series after  Lortab        Delivery  Percocet    Yeast:   Tramadol      Femstat  Tylenol 3      Gyne-lotrimin  Ultram       Monistat  Vicodin           MISC:         All Sunscreens           Hair Coloring/highlights          Insect Repellant's          (Including DEET)         Mystic Tans Eating Plan for Pregnant Women While you are pregnant, your body will require additional nutrition to help support your growing baby. It is recommended that you consume:  150 additional calories each day during your first trimester.  300 additional calories each day during your second trimester.  300 additional calories each day during your third trimester.  Eating a healthy, well-balanced diet is very important for your health and for your baby's health. You also have a higher need for some vitamins and minerals, such as folic acid, calcium, iron, and vitamin D. What do I need to know about eating during pregnancy?  Do not try to lose weight or go on a diet during pregnancy.  Choose healthy, nutritious foods. Choose  of a sandwich with a glass of milk instead of a candy bar or a high-calorie sugar-sweetened beverage.  Limit your overall intake of foods that have "empty calories." These are foods that have little nutritional  value, such as sweets, desserts, candies, sugar-sweetened beverages, and fried foods.  Eat a variety of foods, especially fruits and vegetables.  Take a prenatal vitamin to help meet the additional needs during pregnancy, specifically for folic acid, iron, calcium, and vitamin D.  Remember to stay active. Ask your health care provider for exercise recommendations that are specific to you.  Practice good food safety and cleanliness, such as washing your hands before you eat and after you prepare raw meat. This helps to prevent foodborne illnesses, such as listeriosis, that can be very dangerous for your baby. Ask your health care provider for more information about listeriosis. What does 150 extra calories look like? Healthy options for an additional 150 calories each day could be any of the following:  Plain low-fat yogurt (6-8 oz) with  cup of berries.  1 apple with 2 teaspoons of peanut butter.  Cut-up vegetables with  cup of hummus.  Low-fat chocolate milk (8 oz or 1 cup).  1 string cheese with 1 medium orange.   of a peanut butter and jelly sandwich on whole-wheat bread (1   tsp of peanut butter).  For 300 calories, you could eat two of those healthy options each day. What is a healthy amount of weight to gain? The recommended amount of weight for you to gain is based on your pre-pregnancy BMI. If your pre-pregnancy BMI was:  Less than 18 (underweight), you should gain 28-40 lb.  18-24.9 (normal), you should gain 25-35 lb.  25-29.9 (overweight), you should gain 15-25 lb.  Greater than 30 (obese), you should gain 11-20 lb.  What if I am having twins or multiples? Generally, pregnant women who will be having twins or multiples may need to increase their daily calories by 300-600 calories each day. The recommended range for total weight gain is 25-54 lb, depending on your pre-pregnancy BMI. Talk with your health care provider for specific guidance about additional nutritional  needs, weight gain, and exercise during your pregnancy. What foods can I eat? Grains Any grains. Try to choose whole grains, such as whole-wheat bread, oatmeal, or brown rice. Vegetables Any vegetables. Try to eat a variety of colors and types of vegetables to get a full range of vitamins and minerals. Remember to wash your vegetables well before eating. Fruits Any fruits. Try to eat a variety of colors and types of fruit to get a full range of vitamins and minerals. Remember to wash your fruits well before eating. Meats and Other Protein Sources Lean meats, including chicken, [place], fish, and lean cuts of beef, veal, or pork. Make sure that all meats are cooked to "well done." Tofu. Tempeh. Beans. Eggs. Peanut butter and other nut butters. Seafood, such as shrimp, crab, and lobster. If you choose fish, select types that are higher in omega-3 fatty acids, including salmon, herring, mussels, trout, sardines, and pollock. Make sure that all meats are cooked to food-safe temperatures. Dairy Pasteurized milk and milk alternatives. Pasteurized yogurt and pasteurized cheese. Cottage cheese. Sour cream. Beverages Water. Juices that contain 100% fruit juice or vegetable juice. Caffeine-free teas and decaffeinated coffee. Drinks that contain caffeine are okay to drink, but it is better to avoid caffeine. Keep your total caffeine intake to less than 200 mg each day (12 oz of coffee, tea, or soda) or as directed by your health care provider. Condiments Any pasteurized condiments. Sweets and Desserts Any sweets and desserts. Fats and Oils Any fats and oils. The items listed above may not be a complete list of recommended foods or beverages. Contact your dietitian for more options. What foods are not recommended? Vegetables Unpasteurized (raw) vegetable juices. Fruits Unpasteurized (raw) fruit juices. Meats and Other Protein Sources Cured meats that have nitrates, such as bacon, salami, and hotdogs.  Luncheon meats, bologna, or other deli meats (unless they are reheated until they are steaming hot). Refrigerated pate, meat spreads from a meat counter, smoked seafood that is found in the refrigerated section of a store. Raw fish, such as sushi or sashimi. High mercury content fish, such as tilefish, shark, swordfish, and king mackerel. Raw meats, such as tuna or beef tartare. Undercooked meats and poultry. Make sure that all meats are cooked to food-safe temperatures. Dairy Unpasteurized (raw) milk and any foods that have raw milk in them. Soft cheeses, such as feta, queso blanco, queso fresco, Brie, Camembert cheeses, blue-veined cheeses, and Panela cheese (unless it is made with pasteurized milk, which must be stated on the label). Beverages Alcohol. Sugar-sweetened beverages, such as sodas, teas, or energy drinks. Condiments Homemade fermented foods and drinks, such as pickles, sauerkraut, or kombucha drinks. (Store-bought  pasteurized versions of these are okay.) Other Salads that are made in the store, such as ham salad, chicken salad, egg salad, tuna salad, and seafood salad. The items listed above may not be a complete list of foods and beverages to avoid. Contact your dietitian for more information. This information is not intended to replace advice given to you by your health care provider. Make sure you discuss any questions you have with your health care provider. Document Released: 09/12/2014 Document Revised: 05/05/2016 Document Reviewed: 05/13/2014 Elsevier Interactive Patient Education  2018 [place] of Pregnancy The second trimester is from week 13 through week 28, month 4 through 6. This is often the time in pregnancy that you feel your best. Often times, morning sickness has lessened or quit. You may have more energy, and you may get hungry more often. Your unborn baby (fetus) is growing rapidly. At the end of the sixth month, he or she is about 9 inches long  and weighs about 1 pounds. You will likely feel the baby move (quickening) between 18 and 20 weeks of pregnancy. Follow these instructions at home:  Avoid all smoking, herbs, and alcohol. Avoid drugs not approved by your doctor.  Do not use any tobacco products, including cigarettes, chewing tobacco, and electronic cigarettes. If you need help quitting, ask your doctor. You may get counseling or other support to help you quit.  Only take medicine as told by your doctor. Some medicines are safe and some are not during pregnancy.  Exercise only as told by your doctor. Stop exercising if you start having cramps.  Eat regular, healthy meals.  Wear a good support bra if your breasts are tender.  Do not use hot tubs, steam rooms, or saunas.  Wear your seat belt when driving.  Avoid raw meat, uncooked cheese, and liter boxes and soil used by cats.  Take your prenatal vitamins.  Take 1500-2000 milligrams of calcium daily starting at the 20th week of pregnancy until you deliver your baby.  Try taking medicine that helps you poop (stool softener) as needed, and if your doctor approves. Eat more fiber by eating fresh fruit, vegetables, and whole grains. Drink enough fluids to keep your pee (urine) clear or pale yellow.  Take warm water baths (sitz baths) to soothe pain or discomfort caused by hemorrhoids. Use hemorrhoid cream if your doctor approves.  If you have puffy, bulging veins (varicose veins), wear support hose. Raise (elevate) your feet for 15 minutes, 3-4 times a day. Limit salt in your diet.  Avoid heavy lifting, wear low heals, and sit up straight.  Rest with your legs raised if you have leg cramps or low back pain.  Visit your dentist if you have not gone during your pregnancy. Use a soft toothbrush to brush your teeth. Be gentle when you floss.  You can have sex (intercourse) unless your doctor tells you not to.  Go to your doctor visits. Get help if:  You feel  dizzy.  You have mild cramps or pressure in your lower belly (abdomen).  You have a nagging pain in your belly area.  You continue to feel sick to your stomach (nauseous), throw up (vomit), or have watery poop (diarrhea).  You have bad smelling fluid coming from your vagina.  You have pain with peeing (urination). Get help right away if:  You have a fever.  You are leaking fluid from your vagina.  You have spotting or bleeding from your vagina.  You have  severe belly cramping or pain.  You lose or gain weight rapidly.  You have trouble catching your breath and have chest pain.  You notice sudden or extreme puffiness (swelling) of your face, hands, ankles, feet, or legs.  You have not felt the baby move in over an hour.  You have severe headaches that do not go away with medicine.  You have vision changes. This information is not intended to replace advice given to you by your health care provider. Make sure you discuss any questions you have with your health care provider. Document Released: 02/22/2010 Document Revised: 05/05/2016 Document Reviewed: 01/29/2013 Elsevier Interactive Patient Education  2017 Elsevier Inc[employer] WHAT OB PATIENTS CAN EXPECT   Confirmation of pregnancy and ultrasound ordered if medically indicated-[redacted] weeks gestation  New OB (NOB) intake with nurse and New OB (NOB) labs- [redacted] weeks gestation  New OB (NOB) physical examination with provider- 11/[redacted] weeks gestation  Flu vaccine-[redacted] weeks gestation  Anatomy scan-[redacted] weeks gestation  Glucose tolerance test, blood work to test for anemia, T-dap vaccine-[redacted] weeks gestation  Vaginal swabs/cultures-STD/Group B strep-[redacted] weeks gestation  Appointments every 4 weeks until 28 weeks  Every 2 weeks from 28 weeks until 36 weeks  Weekly visits from 36 weeks until delivery

## 2017-12-11 ENCOUNTER — Emergency Department: Payer: BLUE CROSS/BLUE SHIELD

## 2017-12-11 ENCOUNTER — Encounter: Payer: Self-pay | Admitting: Certified Nurse Midwife

## 2017-12-11 ENCOUNTER — Other Ambulatory Visit: Payer: Self-pay

## 2017-12-11 ENCOUNTER — Emergency Department
Admission: EM | Admit: 2017-12-11 | Discharge: 2017-12-11 | Disposition: A | Discharge status: Home / Self Care | Payer: BLUE CROSS/BLUE SHIELD | Attending: Student in an Organized Health Care Education/Training Program | Admitting: Student in an Organized Health Care Education/Training Program

## 2017-12-11 ENCOUNTER — Encounter: Payer: Self-pay | Admitting: Emergency Medicine

## 2017-12-11 DIAGNOSIS — Z3A12 12 weeks gestation of pregnancy: Secondary | ICD-10-CM | POA: Insufficient documentation

## 2017-12-11 DIAGNOSIS — O209 Hemorrhage in early pregnancy, unspecified: Secondary | ICD-10-CM | POA: Diagnosis present

## 2017-12-11 DIAGNOSIS — Z79899 Other long term (current) drug therapy: Secondary | ICD-10-CM | POA: Diagnosis not present

## 2017-12-11 DIAGNOSIS — O210 Mild hyperemesis gravidarum: Secondary | ICD-10-CM | POA: Diagnosis not present

## 2017-12-11 DIAGNOSIS — R112 Nausea with vomiting, unspecified: Secondary | ICD-10-CM

## 2017-12-11 DIAGNOSIS — O4690 Antepartum hemorrhage, unspecified, unspecified trimester: Secondary | ICD-10-CM

## 2017-12-11 LAB — PAP IG, CT-NG, RFX HPV ASCU
CHLAMYDIA, NUC. ACID AMP: NEGATIVE
Gonococcus by Nucleic Acid Amp: NEGATIVE
PAP Smear Comment: 0

## 2017-12-11 LAB — HCG, QUANTITATIVE, PREGNANCY: hCG, Beta Chain, Quant, S: 124261 m[IU]/mL — ABNORMAL HIGH (ref <5)

## 2017-12-11 LAB — ABO/RH: ABO/RH(D): A POS

## 2017-12-11 MED ORDER — DOXYLAMINE-PYRIDOXINE 10-10 MG PO TBEC: 1.0000 | DELAYED_RELEASE_TABLET | Freq: Two times a day (BID) | ORAL | 0 refills | Status: DC

## 2017-12-11 NOTE — Progress Notes (Signed)
NEW OB HISTORY AND PHYSICAL  SUBJECTIVE:       Stephanie Walker is a 21 y.o. [id number] female, Patient's last menstrual period was 09/12/2017 (approximate)., Estimated Date of Delivery: 06/19/18, [id number], presents today for establishment of Prenatal Care.  She has no unusual complains. Endorses breast tenderness and resolving nausea without vomiting.   Denies difficulty breathing or respiratory distress, chest pain, abdominal pain, vaginal bleeding, dysuria, and leg pain or swelling.   Declines genetic screening.   Gynecologic History  Patient's last menstrual period was 09/12/2017 (approximate).   Contraception: none   Last Pap: due.   Obstetric History  OB History  Gravida Para Term Preterm AB Living  1 0 0 0 0 0  SAB TAB Ectopic Multiple Live Births  0 0 0 0 0    # Outcome Date GA Lbr Len/2nd Weight Sex Delivery Anes PTL Lv  1 Current               Past Medical History:  Diagnosis Date  . Acne   . Oligomenorrhea     Past Surgical History:  Procedure Laterality Date  . NO PAST SURGERIES      Current Outpatient Medications on File Prior to Visit  Medication Sig Dispense Refill  . prenatal vitamin w/FE, FA (NATACHEW) 29-1 MG CHEW chewable tablet Chew 1 tablet by mouth daily at 12 noon.     No current facility-administered medications on file prior to visit.     No Known Allergies  Social History   Socioeconomic History  . Marital status: In a relationship    Spouse name: Not on file  . Number of children: Not on file  . Years of education: Not on file  . Highest education level: Not on file  Social Needs  . Financial resource strain: Not on file  . Food insecurity - worry: Not on file  . Food insecurity - inability: Not on file  . Transportation needs - medical: Not on file  . Transportation needs - non-medical: Not on file  Occupational History  . Not on file  Tobacco Use  . Smoking status: Never Smoker  . Smokeless tobacco: Never Used  Substance and  Sexual Activity  . Alcohol use: Yes    Comment: weekly   . Drug use: No  . Sexual activity: Yes    Birth control/protection: None  Other Topics Concern  . Not on file  Social History Narrative  . Not on file    Family History  Problem Relation Age of Onset  . Hypertension Mother   . Breast cancer Neg Hx   . Ovarian cancer Neg Hx   . Colon cancer Neg Hx   . Diabetes Neg Hx     The following portions of the patient's history were reviewed and updated as appropriate: allergies, current medications, past OB history, past medical history, past surgical history, past family history, past social history, and problem list.  OBJECTIVE:  BP (!) 121/59   Pulse 79   Wt 132 lb 8 oz (60.1 kg)   LMP 09/12/2017 (Approximate)   BMI 24.23 kg/m   Initial Physical Exam (New OB)  GENERAL APPEARANCE: alert, well appearing, in no apparent distress  HEAD: normocephalic, atraumatic  MOUTH: mucous membranes moist, pharynx normal without lesions and dental hygiene good  THYROID: no thyromegaly or masses present  BREASTS: no masses noted, no significant tenderness, no palpable axillary nodes, no skin changes  LUNGS: clear to auscultation, no wheezes, rales or rhonchi, symmetric air entry  HEART: regular rate and rhythm, no murmurs  ABDOMEN: soft, nontender, nondistended, no abnormal masses, no epigastric pain, fundus not palpable and FHT present  EXTREMITIES: no redness or tenderness in the calves or thighs, no edema  SKIN: normal coloration and turgor, no rashes  LYMPH NODES: no adenopathy palpable  NEUROLOGIC: alert, oriented, normal speech, no focal findings or movement disorder noted  PELVIC EXAM EXTERNAL GENITALIA: normal appearing vulva with no masses, tenderness or lesions VAGINA: no abnormal discharge or lesions CERVIX: no lesions or cervical motion tenderness UTERUS: gravid and consistent with 12 weeks ADNEXA: no masses palpable and nontender OB EXAM PELVIMETRY: appears  adequate  ASSESSMENT:  Normal pregnancy Declines genetic screening Rubella non-immune Screening cervical cancer  PLAN: Prenatal care New OB counseling: The patient has been given an overview regarding routine prenatal care. Recommendations regarding diet, weight gain, and exercise in pregnancy were given. Prenatal testing, optional genetic testing, and ultrasound use in pregnancy were reviewed.  Benefits of Breast Feeding were discussed. The patient is encouraged to consider nursing her baby post partum. See orders   Gunnar BullaJenkins Michelle Tiffine Henigan, CNM [facility], [id number]

## 2017-12-11 NOTE — ED Provider Notes (Signed)
[facility] Emergency Department Provider Note    First MD Initiated Contact with Patient 12/11/17 1504     (approximate)  I have reviewed the triage vital signs and the nursing notes.   HISTORY  Chief Complaint Vaginal Bleeding    HPI Stephanie Walker is a 21 y.o. female G1P0000 roughly 12 weeks by LMP presents with vaginal spotting that started last night.  Patient did recently have Pap smear done in clinic.  Denies any trauma.  No heavy blood vaginal bleeding states there is only soaking through one pad in 24 hours.  Denies any fevers.  No dysuria.  No pelvic pain but did have nausea and vomiting.  Had some vaginal cramping yesterday.    Past Medical History:  Diagnosis Date  . Acne   . Oligomenorrhea    Family History  Problem Relation Age of Onset  . Hypertension Mother   . Breast cancer Neg Hx   . Ovarian cancer Neg Hx   . Colon cancer Neg Hx   . Diabetes Neg Hx    Past Surgical History:  Procedure Laterality Date  . NO PAST SURGERIES     Patient Active Problem List   Diagnosis Date Noted  . Rubella non-immune status, antepartum 11/21/2017      Prior to Admission medications   Medication Sig Start Date End Date Taking? Authorizing Provider  Doxylamine-Pyridoxine 10-10 MG TBEC Take 1 tablet by mouth 2 (two) times daily. 12/11/17   Willy Eddyobinson, Ramesha Poster, MD  prenatal vitamin w/FE, FA (NATACHEW) 29-1 MG CHEW chewable tablet Chew 1 tablet by mouth daily at 12 noon.    [provider]    Allergies Patient has no known allergies.    Social History Social History   Tobacco Use  . Smoking status: Never Smoker  . Smokeless tobacco: Never Used  Substance Use Topics  . Alcohol use: Yes    Comment: weekly   . Drug use: No    Review of Systems Patient denies headaches, rhinorrhea, blurry vision, numbness, shortness of breath, chest pain, edema, cough, abdominal pain, nausea, vomiting, diarrhea, dysuria, fevers, rashes or  hallucinations unless otherwise stated above in HPI. ____________________________________________   PHYSICAL EXAM:  VITAL SIGNS: Vitals:   12/11/17 1346  BP: 113/63  Pulse: 88  Resp: 16  Temp: 98.5 F (36.9 C)  SpO2: 99%    Constitutional: Alert and oriented. Well appearing and in no acute distress. Eyes: Conjunctivae are normal.  Head: Atraumatic. Nose: No congestion/rhinnorhea. Mouth/Throat: Mucous membranes are moist.   Neck: Painless ROM.  Cardiovascular:   Good peripheral circulation. Respiratory: Normal respiratory effort.  No retractions.  Gastrointestinal: Soft and nontender.  GU declined Musculoskeletal: No lower extremity tenderness .  No joint effusions. Neurologic:  Normal speech and language. No gross focal neurologic deficits are appreciated.  Skin:  Skin is warm, dry and intact. No rash noted. Psychiatric: Mood and affect are normal. Speech and behavior are normal.  ____________________________________________   LABS (all labs ordered are listed, but only abnormal results are displayed)  Results for orders placed or performed during the hospital encounter of 12/11/17 (from the past 24 hour(s))  hCG, quantitative, pregnancy     Status: Abnormal   Collection Time: 12/11/17  1:45 PM  Result Value Ref Range   hCG, Beta Chain, Quant, S 124,261 (H) <5 mIU/mL  ABO/Rh     Status: None   Collection Time: 12/11/17  1:46 PM  Result Value Ref Range   ABO/RH(D)  A POS Performed at [facility], [address] [place], [place] [address]    ____________________________________________ _________________________________  RADIOLOGY  I personally reviewed all radiographic images ordered to evaluate for the above acute complaints and reviewed radiology reports and findings.  These findings were personally discussed with the patient.  Please see medical record for radiology  report.  ____________________________________________   PROCEDURES  Procedure(s) performed:  Procedures    Critical Care performed: no ____________________________________________   INITIAL IMPRESSION / ASSESSMENT AND PLAN / ED COURSE  Pertinent labs & imaging results that were available during my care of the patient were reviewed by me and considered in my medical decision making (see chart for details).  DDX: Threatened AB, miscarriage, subchorionic hematoma  Stephanie Walker is a 21 y.o. who presents to the ED with symptoms as described above.  Ultrasound ordered for the above differential shows no evidence of ectopic.  Most consistent with threatened AB.  Ultrasound is reassuring.  Patient is Rh+.  Her abdominal exam is soft and benign.  Just had pelvic performed in clinic and she has declined repeat evaluation at this time.  Denies any symptoms of UTI.  Patient does describe some symptoms of morning sickness but has been able to tolerate food water.  Patient was able to tolerate PO and was able to ambulate with a steady gait.  Have discussed with the patient and available family all diagnostics and treatments performed thus far and all questions were answered to the best of my ability. The patient demonstrates understanding and agreement with plan.       ____________________________________________   FINAL CLINICAL IMPRESSION(S) / ED DIAGNOSES  Final diagnoses:  Vaginal bleeding in pregnancy  Non-intractable vomiting with nausea, unspecified vomiting type      NEW MEDICATIONS STARTED DURING THIS VISIT:  This SmartLink is deprecated. Use AVSMEDLIST instead to display the medication list for a patient.   Note:  This document was prepared using Dragon voice recognition software and may include unintentional dictation errors.     Willy Eddyobinson, Izan Miron, MD 12/11/17 1540

## 2017-12-11 NOTE — ED Triage Notes (Signed)
[redacted] weeks pregnant here for vaginal bleeding.  OB closed today.  Describes as dark red/mucous. Some mild lower cramping with bleeding.  No other symptoms.

## 2017-12-12 NOTE — L&D Delivery Note (Signed)
     Delivery Note   Stephanie Walker is a 89 y.o. G1P0000 at 4367w5d Estimated Date of Delivery: 06/19/18  PRE-OPERATIVE DIAGNOSIS:  1) 8867w5d pregnancy.   POST-OPERATIVE DIAGNOSIS:  1) 5767w5d pregnancy s/p Vaginal, Spontaneous   Delivery Type: Vaginal, Spontaneous    Delivery Anesthesia: Epidural   Labor Complications:   Proteinuria     ESTIMATED BLOOD LOSS: 300 ml    FINDINGS:   1) female infant, Apgar scores of 8    at 1 minute and 10    at 5 minutes and a birthweight pending , infant skin to skin 2) Nuchal cord: no  SPECIMENS:   PLACENTA:   Appearance: Intact , 3 vessel cord    Removal: Spontaneous      Disposition:   held per protocol then discarded  DISPOSITION:  Infant to left in stable condition in the delivery room, with L&D personnel and mother,  NARRATIVE SUMMARY: Labor course:  Ms. Stephanie ChristmasBrandy Dente is a G1P0000 at 7667w5d who presented for induction of labor.  She progressed well in labor with Cytotoc and AROM.  She received the appropriate epidural anesthesia and proceeded to complete dilation. She evidenced good maternal expulsive effort during the second stage. She went on to deliver a viable female infant "Everly". The placenta delivered without problems and was noted to be complete. A perineal and vaginal examination was performed. Episiotomy/Lacerations: 2nd degree  Lacerations were repaired with 3-0 Vicryl Rapide suture. The patient tolerated this well.  Doreene BurkeAnnie Keysean Savino, CNM/ Vito BergerShankia Creacy SNM 06/17/2018 7:04 PM

## 2018-01-05 ENCOUNTER — Ambulatory Visit (INDEPENDENT_AMBULATORY_CARE_PROVIDER_SITE_OTHER): Payer: BLUE CROSS/BLUE SHIELD | Admitting: Certified Nurse Midwife

## 2018-01-05 ENCOUNTER — Encounter: Payer: Self-pay | Admitting: Certified Nurse Midwife

## 2018-01-05 VITALS — BP 104/61 | HR 80 | Wt 133.4 lb

## 2018-01-05 DIAGNOSIS — Z3402 Encounter for supervision of normal first pregnancy, second trimester: Secondary | ICD-10-CM

## 2018-01-05 LAB — POCT URINALYSIS DIPSTICK
BILIRUBIN UA: NEGATIVE
Glucose, UA: NEGATIVE
Leukocytes, UA: NEGATIVE
Nitrite, UA: NEGATIVE
PH UA: 6 (ref 5.0–8.0)
Protein, UA: NEGATIVE
RBC UA: NEGATIVE
SPEC GRAV UA: 1.025 (ref 1.010–1.025)
UROBILINOGEN UA: 0.2 U/dL

## 2018-01-05 NOTE — Patient Instructions (Signed)

## 2018-01-05 NOTE — Progress Notes (Signed)
[name], doing well. No complaints. Is not feeling movement yet. Reassurance given . Discussed anatomy u/s at next visit. She verbalizes understanding and agrees to plan. Follow up 4 wks.   Doreene BurkeAnnie Bocephus Cali, CNM

## 2018-01-05 NOTE — Progress Notes (Signed)
Pt is here for an [name] visit. States spotting has stopped.

## 2018-02-02 ENCOUNTER — Ambulatory Visit (INDEPENDENT_AMBULATORY_CARE_PROVIDER_SITE_OTHER): Payer: BLUE CROSS/BLUE SHIELD | Admitting: Certified Nurse Midwife

## 2018-02-02 ENCOUNTER — Encounter: Payer: Self-pay | Admitting: Certified Nurse Midwife

## 2018-02-02 ENCOUNTER — Ambulatory Visit (INDEPENDENT_AMBULATORY_CARE_PROVIDER_SITE_OTHER): Payer: BLUE CROSS/BLUE SHIELD

## 2018-02-02 VITALS — BP 99/49 | HR 85 | Wt 137.6 lb

## 2018-02-02 DIAGNOSIS — Z3482 Encounter for supervision of other normal pregnancy, second trimester: Secondary | ICD-10-CM

## 2018-02-02 DIAGNOSIS — Z3402 Encounter for supervision of normal first pregnancy, second trimester: Secondary | ICD-10-CM

## 2018-02-02 LAB — POCT URINALYSIS DIPSTICK
Bilirubin, UA: NEGATIVE
Blood, UA: NEGATIVE
Glucose: NEGATIVE
Ketones, UA: NEGATIVE
NITRITE UA: NEGATIVE
ODOR: NEGATIVE
PH UA: 7.5 (ref 5.0–8.0)
Spec Grav, UA: 1.01 (ref 1.010–1.025)
UROBILINOGEN UA: 0.2 U/dL

## 2018-02-02 NOTE — Progress Notes (Signed)
[name]-Doing well. Anatomy scan today wnl, findings reviewed with patient and family. Plans breastfeeding. Education regarding home treatment for round ligament pain and intrapartum pain management options. [facility] schedule and [facility] pamph[facility] given. Anticipatory guidance regarding course of prenatal care. Reviewed red flag symptoms and when to call. RTC x 4 weeks for [name] or sooner if needed.   ULTRASOUND REPORT  Location: [facility] Date of Service:  02/02/2018  Indications: Anatomy Findings:  Mason JimSingleton intrauterine pregnancy is visualized with FHR at 153 BPM. Biometrics give an (U/S) Gestational age of [redacted] weeks and an (U/S) EDD of 06/22/18; this correlates with the clinically established EDD of 06/19/18.  Fetal presentation is vertex.  EFW: 334 grams (0lb 12oz). Placenta: Posterior and grade 1.  Placenta is 5.0 cm from cervical os. AFI: WNL subjectively.  Anatomic survey is complete and appears WNL; Gender - Surprise.   Right Ovary measures 1.7 x 1.2 x 0.7 cm. It is normal in appearance. Left Ovary measures 2.0 x 1.3 x 1.4 cm. It is normal appearance. There is no obvoius evidence of a corpus luteal cyst. Survey of the adnexa demonstrates no adnexal masses. There is no free peritoneal fluid in the cul de sac.  Impression: 1. 20 week Viable Singleton Intrauterine pregnancy by U/S. 2. (U/S) EDD is consistent with Clinically established (LMP) EDD of 06/19/18. 3. Normal Anatomy Scan  Recommendations: 1.Clinical correlation with the patient's History and Physical Exam.

## 2018-02-02 NOTE — Progress Notes (Signed)
[name]- no complaints.  

## 2018-02-02 NOTE — Patient Instructions (Signed)
Common Medications Safe in Pregnancy  Acne:      Constipation:  Benzoyl Peroxide     Colace  Clindamycin      Dulcolax Suppository  Topica Erythromycin     Fibercon  Salicylic Acid      Metamucil         Miralax AVOID:        Senakot   Accutane    Cough:  Retin-A       Cough Drops  Tetracycline      Phenergan w/ Codeine if Rx  Minocycline      Robitussin (Plain & DM)  Antibiotics:     Crabs/Lice:  Ceclor       RID  Cephalosporins    AVOID:  E-Mycins      Kwell  Keflex  Macrobid/Macrodantin   Diarrhea:  Penicillin      Kao-Pectate  Zithromax      Imodium AD         PUSH FLUIDS AVOID:       Cipro     Fever:  Tetracycline      Tylenol (Regular or Extra  Minocycline       Strength)  Levaquin      Extra Strength-Do not          Exceed 8 tabs/24 hrs Caffeine:        <200mg/day (equiv. To 1 cup of coffee or  approx. 3 12 oz sodas)         Gas: Cold/Hayfever:       Gas-X  Benadryl      Mylicon  Claritin       Phazyme  **Claritin-D        Chlor-Trimeton    Headaches:  Dimetapp      ASA-Free Excedrin  Drixoral-Non-Drowsy     Cold Compress  Mucinex (Guaifenasin)     Tylenol (Regular or Extra  Sudafed/Sudafed-12 Hour     Strength)  **Sudafed PE Pseudoephedrine   Tylenol Cold & Sinus     Vicks Vapor Rub  Zyrtec  **AVOID if Problems With Blood Pressure         Heartburn: Avoid lying down for at least 1 hour after meals  Aciphex      Maalox     Rash:  Milk of Magnesia     Benadryl    Mylanta       1% Hydrocortisone Cream  Pepcid  Pepcid Complete   Sleep Aids:  Prevacid      Ambien   Prilosec       Benadryl  Rolaids       Chamomile Tea  Tums (Limit 4/day)     Unisom  Zantac       Tylenol PM         Warm milk-add vanilla or  Hemorrhoids:       Sugar for taste  Anusol/Anusol H.C.  (RX: Analapram 2.5%)  Sugar Substitutes:  Hydrocortisone OTC     Ok in moderation  Preparation H      Tucks        Vaseline lotion applied to tissue with  wiping    Herpes:     Throat:  Acyclovir      Oragel  Famvir  Valtrex     Vaccines:         Flu Shot Leg Cramps:       *Gardasil  Benadryl      Hepatitis A         Hepatitis B Nasal Spray:         Pneumovax  Saline Nasal Spray     Polio Booster         Tetanus Nausea:       Tuberculosis test or PPD  Vitamin B6 25 mg TID   AVOID:    Dramamine      *Gardasil  Emetrol       Live Poliovirus  Ginger Root 250 mg QID    MMR (measles, mumps &  High Complex Carbs @ Bedtime    rebella)  Sea Bands-Accupressure    Varicella (Chickenpox)  Unisom 1/2 tab TID     *No known complications           If received before Pain:         Known pregnancy;   Darvocet       Resume series after  Lortab        Delivery  Percocet    Yeast:   Tramadol      Femstat  Tylenol 3      Gyne-lotrimin  Ultram       Monistat  Vicodin           MISC:         All Sunscreens           Hair Coloring/highlights          Insect Repellant's          (Including DEET)         Mystic Tans Back Pain in Pregnancy Back pain during pregnancy is common. Back pain may be caused by several factors that are related to changes during your pregnancy. Follow these instructions at home: Managing pain, stiffness, and swelling  If directed, apply ice for sudden (acute) back pain. ? Put ice in a plastic bag. ? Place a towel between your skin and the bag. ? Leave the ice on for 20 minutes, 2-3 times per day.  If directed, apply heat to the affected area before you exercise: ? Place a towel between your skin and the heat pack or heating pad. ? Leave the heat on for 20-30 minutes. ? Remove the heat if your skin turns bright red. This is especially important if you are unable to feel pain, heat, or cold. You may have a greater risk of getting burned. Activity  Exercise as told by your health care provider. Exercising is the best way to prevent or manage back pain.  Listen to your body when lifting. If lifting hurts, ask for help or  bend your knees. This uses your leg muscles instead of your back muscles.  Squat down when picking up something from the floor. Do not bend over.  Only use bed rest as told by your health care provider. Bed rest should only be used for the most severe episodes of back pain. Standing, Sitting, and Lying Down  Do not stand in one place for long periods of time.  Use good posture when sitting. Make sure your head rests over your shoulders and is not hanging forward. Use a pillow on your lower back if necessary.  Try sleeping on your side, preferably the left side, with a pillow or two between your legs. If you are sore after a night's rest, your bed may be too soft. A firm mattress may provide more support for your back during pregnancy. General instructions  Do not wear high heels.  Eat a healthy diet. Try to gain weight within your health care provider's recommendations.  Use a maternity girdle, elastic sling, or   back brace as told by your health care provider.  Take over-the-counter and prescription medicines only as told by your health care provider.  Keep all follow-up visits as told by your health care provider. This is important. This includes any visits with any specialists, such as a physical therapist. Contact a health care provider if:  Your back pain interferes with your daily activities.  You have increasing pain in other parts of your body. Get help right away if:  You develop numbness, tingling, weakness, or problems with the use of your arms or legs.  You develop severe back pain that is not controlled with medicine.  You have a sudden change in bowel or bladder control.  You develop shortness of breath, dizziness, or you faint.  You develop nausea, vomiting, or sweating.  You have back pain that is a rhythmic, cramping pain similar to labor pains. Labor pain is usually 1-2 minutes apart, lasts for about 1 minute, and involves a bearing down feeling or pressure in  your pelvis.  You have back pain and your water breaks or you have vaginal bleeding.  You have back pain or numbness that travels down your leg.  Your back pain developed after you fell.  You develop pain on one side of your back.  You see blood in your urine.  You develop skin blisters in the area of your back pain. This information is not intended to replace advice given to you by your health care provider. Make sure you discuss any questions you have with your health care provider. Document Released: 03/08/2006 Document Revised: 05/05/2016 Document Reviewed: 08/12/2015 Elsevier Interactive Patient Education  2018 [employer]. Round Ligament Pain The round ligament is a cord of muscle and tissue that helps to support the uterus. It can become a source of pain during pregnancy if it becomes stretched or twisted as the baby grows. The pain usually begins in the second trimester of pregnancy, and it can come and go until the baby is delivered. It is not a serious problem, and it does not cause harm to the baby. Round ligament pain is usually a short, sharp, and pinching pain, but it can also be a dull, lingering, and aching pain. The pain is felt in the lower side of the abdomen or in the groin. It usually starts deep in the groin and moves up to the outside of the hip area. Pain can occur with:  A sudden change in position.  Rolling over in bed.  Coughing or sneezing.  Physical activity.  Follow these instructions at home: Watch your condition for any changes. Take these steps to help with your pain:  When the pain starts, relax. Then try: ? Sitting down. ? Flexing your knees up to your abdomen. ? Lying on your side with one pillow under your abdomen and another pillow between your legs. ? Sitting in a warm bath for 15-20 minutes or until the pain goes away.  Take over-the-counter and prescription medicines only as told by your health care provider.  Move slowly when you sit  and stand.  Avoid long walks if they cause pain.  Stop or lessen your physical activities if they cause pain.  Contact a health care provider if:  Your pain does not go away with treatment.  You feel pain in your back that you did not have before.  Your medicine is not helping. Get help right away if:  You develop a fever or chills.  You develop uterine contractions.  You develop vaginal bleeding.  You develop nausea or vomiting.  You develop diarrhea.  You have pain when you urinate. This information is not intended to replace advice given to you by your health care provider. Make sure you discuss any questions you have with your health care provider. Document Released: 09/06/2008 Document Revised: 05/05/2016 Document Reviewed: 02/04/2015 Elsevier Interactive Patient Education  [employer].

## 2018-03-02 ENCOUNTER — Ambulatory Visit (INDEPENDENT_AMBULATORY_CARE_PROVIDER_SITE_OTHER): Payer: BLUE CROSS/BLUE SHIELD | Admitting: Obstetrics and Gynecology

## 2018-03-02 ENCOUNTER — Encounter: Payer: Self-pay | Admitting: *Deleted

## 2018-03-02 VITALS — BP 115/71 | HR 100 | Wt 146.0 lb

## 2018-03-02 DIAGNOSIS — Z3492 Encounter for supervision of normal pregnancy, unspecified, second trimester: Secondary | ICD-10-CM

## 2018-03-02 LAB — POCT URINALYSIS DIPSTICK
BILIRUBIN UA: NEGATIVE
Blood, UA: NEGATIVE
GLUCOSE UA: NEGATIVE
Ketones, UA: NEGATIVE
Leukocytes, UA: NEGATIVE
Nitrite, UA: NEGATIVE
Protein, UA: NEGATIVE
Spec Grav, UA: 1.015 (ref 1.010–1.025)
Urobilinogen, UA: 0.2 E.U./dL
pH, UA: 6 (ref 5.0–8.0)

## 2018-03-02 NOTE — Progress Notes (Signed)
[name]- doing well, discussed enrolling in classes.

## 2018-03-02 NOTE — Progress Notes (Signed)
[name]- pt is doing well 

## 2018-04-02 ENCOUNTER — Other Ambulatory Visit: Payer: BLUE CROSS/BLUE SHIELD

## 2018-04-02 ENCOUNTER — Ambulatory Visit (INDEPENDENT_AMBULATORY_CARE_PROVIDER_SITE_OTHER): Payer: BLUE CROSS/BLUE SHIELD | Admitting: Certified Nurse Midwife

## 2018-04-02 VITALS — BP 105/65 | HR 99 | Wt 158.9 lb

## 2018-04-02 DIAGNOSIS — Z3483 Encounter for supervision of other normal pregnancy, third trimester: Secondary | ICD-10-CM

## 2018-04-02 DIAGNOSIS — Z23 Encounter for immunization: Secondary | ICD-10-CM | POA: Diagnosis not present

## 2018-04-02 LAB — POCT URINALYSIS DIPSTICK
Bilirubin, UA: NEGATIVE
Blood, UA: NEGATIVE
GLUCOSE UA: NEGATIVE
Ketones, UA: NEGATIVE
Nitrite, UA: NEGATIVE
Protein, UA: 7.5
SPEC GRAV UA: 1.01 (ref 1.010–1.025)
Urobilinogen, UA: 0.2 E.U./dL
pH, UA: 8 (ref 5.0–8.0)

## 2018-04-02 MED ORDER — TETANUS-DIPHTH-ACELL PERTUSSIS 5-2.5-18.5 LF-MCG/0.5 IM SUSP
0.5000 mL | Freq: Once | INTRAMUSCULAR | Status: AC
  Administered 2018-04-02: 0.5 mL via INTRAMUSCULAR

## 2018-04-02 NOTE — Progress Notes (Signed)
[name] pt is doing well no concerns.  

## 2018-04-02 NOTE — Patient Instructions (Addendum)
Breastfeeding Choosing to breastfeed is one of the best decisions you can make for yourself and your baby. A change in hormones during pregnancy causes your breasts to make breast milk in your milk-producing glands. Hormones prevent breast milk from being released before your baby is born. They also prompt milk flow after birth. Once breastfeeding has begun, thoughts of your baby, as well as his or her sucking or crying, can stimulate the release of milk from your milk-producing glands. Benefits of breastfeeding Research shows that breastfeeding offers many health benefits for infants and mothers. It also offers a cost-free and convenient way to feed your baby. For your baby Your first milk (colostrum) helps your baby's digestive system to function better. Special cells in your milk (antibodies) help your baby to fight off infections. Breastfed babies are less likely to develop asthma, allergies, obesity, or type 2 diabetes. They are also at lower risk for sudden infant death syndrome (SIDS). Nutrients in breast milk are better able to meet your baby's needs compared to infant formula. Breast milk improves your baby's brain development. For you Breastfeeding helps to create a very special bond between you and your baby. Breastfeeding is convenient. Breast milk costs nothing and is always available at the correct temperature. Breastfeeding helps to burn calories. It helps you to lose the weight that you gained during pregnancy. Breastfeeding makes your uterus return faster to its size before pregnancy. It also slows bleeding (lochia) after you give birth. Breastfeeding helps to lower your risk of developing type 2 diabetes, osteoporosis, rheumatoid arthritis, cardiovascular disease, and breast, ovarian, uterine, and endometrial cancer later in life. Breastfeeding basics Starting breastfeeding Find a comfortable place to sit or lie down, with your neck and back well-supported. Place a pillow or a  rolled-up blanket under your baby to bring him or her to the level of your breast (if you are seated). Nursing pillows are specially designed to help support your arms and your baby while you breastfeed. Make sure that your baby's tummy (abdomen) is facing your abdomen. Gently massage your breast. With your fingertips, massage from the outer edges of your breast inward toward the nipple. This encourages milk flow. If your milk flows slowly, you may need to continue this action during the feeding. Support your breast with 4 fingers underneath and your thumb above your nipple (make the letter "C" with your hand). Make sure your fingers are well away from your nipple and your baby's mouth. Stroke your baby's lips gently with your finger or nipple. When your baby's mouth is open wide enough, quickly bring your baby to your breast, placing your entire nipple and as much of the areola as possible into your baby's mouth. The areola is the colored area around your nipple. More areola should be visible above your baby's upper lip than below the lower lip. Your baby's lips should be opened and extended outward (flanged) to ensure an adequate, comfortable latch. Your baby's tongue should be between his or her lower gum and your breast. Make sure that your baby's mouth is correctly positioned around your nipple (latched). Your baby's lips should create a seal on your breast and be turned out (everted). It is common for your baby to suck about 2-3 minutes in order to start the flow of breast milk. Latching Teaching your baby how to latch onto your breast properly is very important. An improper latch can cause nipple pain, decreased milk supply, and poor weight gain in your baby. Also, if your baby  is not latched onto your nipple properly, he or she may swallow some air during feeding. This can make your baby fussy. Burping your baby when you switch breasts during the feeding can help to get rid of the air. However,  teaching your baby to latch on properly is still the best way to prevent fussiness from swallowing air while breastfeeding. Signs that your baby has successfully latched onto your nipple Silent tugging or silent sucking, without causing you pain. Infant's lips should be extended outward (flanged). Swallowing heard between every 3-4 sucks once your milk has started to flow (after your let-down milk reflex occurs). Muscle movement above and in front of his or her ears while sucking.  Signs that your baby has not successfully latched onto your nipple Sucking sounds or smacking sounds from your baby while breastfeeding. Nipple pain.  If you think your baby has not latched on correctly, slip your finger into the corner of your baby's mouth to break the suction and place it between your baby's gums. Attempt to start breastfeeding again. Signs of successful breastfeeding Signs from your baby Your baby will gradually decrease the number of sucks or will completely stop sucking. Your baby will fall asleep. Your baby's body will relax. Your baby will retain a small amount of milk in his or her mouth. Your baby will let go of your breast by himself or herself.  Signs from you Breasts that have increased in firmness, weight, and size 1-3 hours after feeding. Breasts that are softer immediately after breastfeeding. Increased milk volume, as well as a change in milk consistency and color by the fifth day of breastfeeding. Nipples that are not sore, cracked, or bleeding.  Signs that your baby is getting enough milk Wetting at least 1-2 diapers during the first 24 hours after birth. Wetting at least 5-6 diapers every 24 hours for the first week after birth. The urine should be clear or pale yellow by the age of 5 days. Wetting 6-8 diapers every 24 hours as your baby continues to grow and develop. At least 3 stools in a 24-hour period by the age of 5 days. The stool should be soft and yellow. At least 3  stools in a 24-hour period by the age of 7 days. The stool should be seedy and yellow. No loss of weight greater than 10% of birth weight during the first 3 days of life. Average weight gain of 4-7 oz (113-198 g) per week after the age of 4 days. Consistent daily weight gain by the age of 5 days, without weight loss after the age of 2 weeks. After a feeding, your baby may spit up a small amount of milk. This is normal. Breastfeeding frequency and duration Frequent feeding will help you make more milk and can prevent sore nipples and extremely full breasts (breast engorgement). Breastfeed when you feel the need to reduce the fullness of your breasts or when your baby shows signs of hunger. This is called "breastfeeding on demand." Signs that your baby is hungry include: Increased alertness, activity, or restlessness. Movement of the head from side to side. Opening of the mouth when the corner of the mouth or cheek is stroked (rooting). Increased sucking sounds, smacking lips, cooing, sighing, or squeaking. Hand-to-mouth movements and sucking on fingers or hands. Fussing or crying.  Avoid introducing a pacifier to your baby in the first 4-6 weeks after your baby is born. After this time, you may choose to use a pacifier. Research has shown  that pacifier use during the first year of a baby's life decreases the risk of sudden infant death syndrome (SIDS). Allow your baby to feed on each breast as long as he or she wants. When your baby unlatches or falls asleep while feeding from the first breast, offer the second breast. Because newborns are often sleepy in the first few weeks of life, you may need to awaken your baby to get him or her to feed. Breastfeeding times will vary from baby to baby. However, the following rules can serve as a guide to help you make sure that your baby is properly fed: Newborns (babies 72 weeks of age or younger) may breastfeed every 1-3 hours. Newborns should not go without  breastfeeding for longer than 3 hours during the day or 5 hours during the night. You should breastfeed your baby a minimum of 8 times in a 24-hour period.  Breast milk pumping Pumping and storing breast milk allows you to make sure that your baby is exclusively fed your breast milk, even at times when you are unable to breastfeed. This is especially important if you go back to work while you are still breastfeeding, or if you are not able to be present during feedings. Your lactation consultant can help you find a method of pumping that works best for you and give you guidelines about how long it is safe to store breast milk. Caring for your breasts while you breastfeed Nipples can become dry, cracked, and sore while breastfeeding. The following recommendations can help keep your breasts moisturized and healthy: Avoid using soap on your nipples. Wear a supportive bra designed especially for nursing. Avoid wearing underwire-style bras or extremely tight bras (sports bras). Air-dry your nipples for 3-4 minutes after each feeding. Use only cotton bra pads to absorb leaked breast milk. Leaking of breast milk between feedings is normal. Use lanolin on your nipples after breastfeeding. Lanolin helps to maintain your skin's normal moisture barrier. Pure lanolin is not harmful (not toxic) to your baby. You may also hand express a few drops of breast milk and gently massage that milk into your nipples and allow the milk to air-dry.  In the first few weeks after giving birth, some women experience breast engorgement. Engorgement can make your breasts feel heavy, warm, and tender to the touch. Engorgement peaks within 3-5 days after you give birth. The following recommendations can help to ease engorgement: Completely empty your breasts while breastfeeding or pumping. You may want to start by applying warm, moist heat (in the shower or with warm, water-soaked hand towels) just before feeding or pumping. This  increases circulation and helps the milk flow. If your baby does not completely empty your breasts while breastfeeding, pump any extra milk after he or she is finished. Apply ice packs to your breasts immediately after breastfeeding or pumping, unless this is too uncomfortable for you. To do this: Put ice in a plastic bag. Place a towel between your skin and the bag. Leave the ice on for 20 minutes, 2-3 times a day. Make sure that your baby is latched on and positioned properly while breastfeeding.  If engorgement persists after 48 hours of following these recommendations, contact your health care provider or a Advertising copywriter. Overall health care recommendations while breastfeeding Eat 3 healthy meals and 3 snacks every day. Well-nourished mothers who are breastfeeding need an additional 450-500 calories a day. You can meet this requirement by increasing the amount of a balanced diet that you eat.  Drink enough water to keep your urine pale yellow or clear. Rest often, relax, and continue to take your prenatal vitamins to prevent fatigue, stress, and low vitamin and mineral levels in your body (nutrient deficiencies). Do not use any products that contain nicotine or tobacco, such as cigarettes and e-cigarettes. Your baby may be harmed by chemicals from cigarettes that pass into breast milk and exposure to secondhand smoke. If you need help quitting, ask your health care provider. Avoid alcohol. Do not use illegal drugs or marijuana. Talk with your health care provider before taking any medicines. These include over-the-counter and prescription medicines as well as vitamins and herbal supplements. Some medicines that may be harmful to your baby can pass through breast milk. It is possible to become pregnant while breastfeeding. If birth control is desired, ask your health care provider about options that will be safe while breastfeeding your baby. Where to find more information: [employer]: www.llli.org Contact a health care provider if: You feel like you want to stop breastfeeding or have become frustrated with breastfeeding. Your nipples are cracked or bleeding. Your breasts are red, tender, or warm. You have: Painful breasts or nipples. A swollen area on either breast. A fever or chills. Nausea or vomiting. Drainage other than breast milk from your nipples. Your breasts do not become full before feedings by the fifth day after you give birth. You feel sad and depressed. Your baby is: Too sleepy to eat well. Having trouble sleeping. More than 52 week old and wetting fewer than 6 diapers in a 24-hour period. Not gaining weight by 31 days of age. Your baby has fewer than 3 stools in a 24-hour period. Your baby's skin or the white parts of his or her eyes become yellow. Get help right away if: Your baby is overly tired (lethargic) and does not want to wake up and feed. Your baby develops an unexplained fever. Summary Breastfeeding offers many health benefits for infant and mothers. Try to breastfeed your infant when he or she shows early signs of hunger. Gently tickle or stroke your baby's lips with your finger or nipple to allow the baby to open his or her mouth. Bring the baby to your breast. Make sure that much of the areola is in your baby's mouth. Offer one side and burp the baby before you offer the other side. Talk with your health care provider or lactation consultant if you have questions or you face problems as you breastfeed. This information is not intended to replace advice given to you by your health care provider. Make sure you discuss any questions you have with your health care provider. Document Released: 11/28/2005 Document Revised: 12/30/2016 Document Reviewed: 12/30/2016 Elsevier Interactive Patient Education  2018 [employer]. Third Trimester of Pregnancy The third trimester is from week 29 through week 42, months 7 through 9. This  trimester is when your unborn baby (fetus) is growing very fast. At the end of the ninth month, the unborn baby is about 20 inches in length. It weighs about 6-10 pounds. Follow these instructions at home:  Avoid all smoking, herbs, and alcohol. Avoid drugs not approved by your doctor.  Do not use any tobacco products, including cigarettes, chewing tobacco, and electronic cigarettes. If you need help quitting, ask your doctor. You may get counseling or other support to help you quit.  Only take medicine as told by your doctor. Some medicines are safe and some are not during pregnancy.  Exercise only  as told by your doctor. Stop exercising if you start having cramps.  Eat regular, healthy meals.  Wear a good support bra if your breasts are tender.  Do not use hot tubs, steam rooms, or saunas.  Wear your seat belt when driving.  Avoid raw meat, uncooked cheese, and liter boxes and soil used by cats.  Take your prenatal vitamins.  Take 1500-2000 milligrams of calcium daily starting at the 20th week of pregnancy until you deliver your baby.  Try taking medicine that helps you poop (stool softener) as needed, and if your doctor approves. Eat more fiber by eating fresh fruit, vegetables, and whole grains. Drink enough fluids to keep your pee (urine) clear or pale yellow.  Take warm water baths (sitz baths) to soothe pain or discomfort caused by hemorrhoids. Use hemorrhoid cream if your doctor approves.  If you have puffy, bulging veins (varicose veins), wear support hose. Raise (elevate) your feet for 15 minutes, 3-4 times a day. Limit salt in your diet.  Avoid heavy lifting, wear low heels, and sit up straight.  Rest with your legs raised if you have leg cramps or low back pain.  Visit your dentist if you have not gone during your pregnancy. Use a soft toothbrush to brush your teeth. Be gentle when you floss.  You can have sex (intercourse) unless your doctor tells you not to.  Do  not travel far distances unless you must. Only do so with your doctor's approval.  Take prenatal classes.  Practice driving to the hospital.  Pack your hospital bag.  Prepare the baby's room.  Go to your doctor visits. Get help if:  You are not sure if you are in labor or if your water has broken.  You are dizzy.  You have mild cramps or pressure in your lower belly (abdominal).  You have a nagging pain in your belly area.  You continue to feel sick to your stomach (nauseous), throw up (vomit), or have watery poop (diarrhea).  You have bad smelling fluid coming from your vagina.  You have pain with peeing (urination). Get help right away if:  You have a fever.  You are leaking fluid from your vagina.  You are spotting or bleeding from your vagina.  You have severe belly cramping or pain.  You lose or gain weight rapidly.  You have trouble catching your breath and have chest pain.  You notice sudden or extreme puffiness (swelling) of your face, hands, ankles, feet, or legs.  You have not felt the baby move in over an hour.  You have severe headaches that do not go away with medicine.  You have vision changes. This information is not intended to replace advice given to you by your health care provider. Make sure you discuss any questions you have with your health care provider. Document Released: 02/22/2010 Document Revised: 05/05/2016 Document Reviewed: 01/29/2013 Elsevier Interactive Patient Education  2017 [employer]  Vaginal Delivery Vaginal delivery means that you will give birth by pushing your baby out of your birth canal (vagina). A team of health care providers will help you before, during, and after vaginal delivery. Birth experiences are unique for every woman and every pregnancy, and birth experiences vary depending on where you choose to give birth. What should I do to prepare for my baby's birth? Before your baby is born, it is important to talk  with your health care provider about:  Your labor and delivery preferences. These may include: ? Medicines  that you may be given. ? How you will manage your pain. This might include non-medical pain relief techniques or injectable pain relief such as epidural analgesia. ? How you and your baby will be monitored during labor and delivery. ? Who may be in the labor and delivery room with you. ? Your feelings about surgical delivery of your baby (cesarean delivery, or C-section) if this becomes necessary. ? Your feelings about receiving donated blood through an IV tube (blood transfusion) if this becomes necessary.  Whether you are able: ? To take pictures or videos of the birth. ? To eat during labor and delivery. ? To move around, walk, or change positions during labor and delivery.  What to expect after your baby is born, such as: ? Whether delayed umbilical cord clamping and cutting is offered. ? Who will care for your baby right after birth. ? Medicines or tests that may be recommended for your baby. ? Whether breastfeeding is supported in your hospital or birth center. ? How long you will be in the hospital or birth center.  How any medical conditions you have may affect your baby or your labor and delivery experience.  To prepare for your baby's birth, you should also:  Attend all of your health care visits before delivery (prenatal visits) as recommended by your health care provider. This is important.  Prepare your home for your baby's arrival. Make sure that you have: ? Diapers. ? Baby clothing. ? Feeding equipment. ? Safe sleeping arrangements for you and your baby.  Install a car seat in your vehicle. Have your car seat checked by a certified car seat installer to make sure that it is installed safely.  Think about who will help you with your new baby at home for at least the first several weeks after delivery.  What can I expect when I arrive at the birth center or  hospital? Once you are in labor and have been admitted into the hospital or birth center, your health care provider may:  Review your pregnancy history and any concerns you have.  Insert an IV tube into one of your veins. This is used to give you fluids and medicines.  Check your blood pressure, pulse, temperature, and heart rate (vital signs).  Check whether your bag of water (amniotic sac) has broken (ruptured).  Talk with you about your birth plan and discuss pain control options.  Monitoring Your health care provider may monitor your contractions (uterine monitoring) and your baby's heart rate (fetal monitoring). You may need to be monitored:  Often, but not continuously (intermittently).  All the time or for long periods at a time (continuously). Continuous monitoring may be needed if: ? You are taking certain medicines, such as medicine to relieve pain or make your contractions stronger. ? You have pregnancy or labor complications.  Monitoring may be done by:  Placing a special stethoscope or a handheld monitoring device on your abdomen to check your baby's heartbeat, and feeling your abdomen for contractions. This method of monitoring does not continuously record your baby's heartbeat or your contractions.  Placing monitors on your abdomen (external monitors) to record your baby's heartbeat and the frequency and length of contractions. You may not have to wear external monitors all the time.  Placing monitors inside of your uterus (internal monitors) to record your baby's heartbeat and the frequency, length, and strength of your contractions. ? Your health care provider may use internal monitors if he or she needs more information  about the strength of your contractions or your baby's heart rate. ? Internal monitors are put in place by passing a thin, flexible wire through your vagina and into your uterus. Depending on the type of monitor, it may remain in your uterus or on your  baby's head until birth. ? Your health care provider will discuss the benefits and risks of internal monitoring with you and will ask for your permission before inserting the monitors.  Telemetry. This is a type of continuous monitoring that can be done with external or internal monitors. Instead of having to stay in bed, you are able to move around during telemetry. Ask your health care provider if telemetry is an option for you.  Physical exam Your health care provider may perform a physical exam. This may include:  Checking whether your baby is positioned: ? With the head toward your vagina (head-down). This is most common. ? With the head toward the top of your uterus (head-up or breech). If your baby is in a breech position, your health care provider may try to turn your baby to a head-down position so you can deliver vaginally. If it does not seem that your baby can be born vaginally, your provider may recommend surgery to deliver your baby. In rare cases, you may be able to deliver vaginally if your baby is head-up (breech delivery). ? Lying sideways (transverse). Babies that are lying sideways cannot be delivered vaginally.  Checking your cervix to determine: ? Whether it is thinning out (effacing). ? Whether it is opening up (dilating). ? How low your baby has moved into your birth canal.  What are the three stages of labor and delivery?  Normal labor and delivery is divided into the following three stages: Stage 1  Stage 1 is the longest stage of labor, and it can last for hours or days. Stage 1 includes: ? Early labor. This is when contractions may be irregular, or regular and mild. Generally, early labor contractions are more than 10 minutes apart. ? Active labor. This is when contractions get longer, more regular, more frequent, and more intense. ? The transition phase. This is when contractions happen very close together, are very intense, and may last longer than during any  other part of labor.  Contractions generally feel mild, infrequent, and irregular at first. They get stronger, more frequent (about every 2-3 minutes), and more regular as you progress from early labor through active labor and transition.  Many women progress through stage 1 naturally, but you may need help to continue making progress. If this happens, your health care provider may talk with you about: ? Rupturing your amniotic sac if it has not ruptured yet. ? Giving you medicine to help make your contractions stronger and more frequent.  Stage 1 ends when your cervix is completely dilated to 4 inches (10 cm) and completely effaced. This happens at the end of the transition phase. Stage 2  Once your cervix is completely effaced and dilated to 4 inches (10 cm), you may start to feel an urge to push. It is common for the body to naturally take a rest before feeling the urge to push, especially if you received an epidural or certain other pain medicines. This rest period may last for up to 1-2 hours, depending on your unique labor experience.  During stage 2, contractions are generally less painful, because pushing helps relieve contraction pain. Instead of contraction pain, you may feel stretching and burning pain, especially when the  widest part of your baby's head passes through the vaginal opening (crowning).  Your health care provider will closely monitor your pushing progress and your baby's progress through the vagina during stage 2.  Your health care provider may massage the area of skin between your vaginal opening and anus (perineum) or apply warm compresses to your perineum. This helps it stretch as the baby's head starts to crown, which can help prevent perineal tearing. ? In some cases, an incision may be made in your perineum (episiotomy) to allow the baby to pass through the vaginal opening. An episiotomy helps to make the opening of the vagina larger to allow more room for the baby to  fit through.  It is very important to breathe and focus so your health care provider can control the delivery of your baby's head. Your health care provider may have you decrease the intensity of your pushing, to help prevent perineal tearing.  After delivery of your baby's head, the shoulders and the rest of the body generally deliver very quickly and without difficulty.  Once your baby is delivered, the umbilical cord may be cut right away, or this may be delayed for 1-2 minutes, depending on your baby's health. This may vary among health care providers, hospitals, and birth centers.  If you and your baby are healthy enough, your baby may be placed on your chest or abdomen to help maintain the baby's temperature and to help you bond with each other. Some mothers and babies start breastfeeding at this time. Your health care team will dry your baby and help keep your baby warm during this time.  Your baby may need immediate care if he or she: ? Showed signs of distress during labor. ? Has a medical condition. ? Was born too early (prematurely). ? Had a bowel movement before birth (meconium). ? Shows signs of difficulty transitioning from being inside the uterus to being outside of the uterus. If you are planning to breastfeed, your health care team will help you begin a feeding. Stage 3  The third stage of labor starts immediately after the birth of your baby and ends after you deliver the placenta. The placenta is an organ that develops during pregnancy to provide oxygen and nutrients to your baby in the womb.  Delivering the placenta may require some pushing, and you may have mild contractions. Breastfeeding can stimulate contractions to help you deliver the placenta.  After the placenta is delivered, your uterus should tighten (contract) and become firm. This helps to stop bleeding in your uterus. To help your uterus contract and to control bleeding, your health care provider may: ? Give  you medicine by injection, through an IV tube, by mouth, or through your rectum (rectally). ? Massage your abdomen or perform a vaginal exam to remove any blood clots that are left in your uterus. ? Empty your bladder by placing a thin, flexible tube (catheter) into your bladder. ? Encourage you to breastfeed your baby. After labor is over, you and your baby will be monitored closely to ensure that you are both healthy until you are ready to go home. Your health care team will teach you how to care for yourself and your baby. This information is not intended to replace advice given to you by your health care provider. Make sure you discuss any questions you have with your health care provider. Document Released: 09/06/2008 Document Revised: 06/17/2016 Document Reviewed: 12/13/2015 Elsevier Interactive Patient Education  2018 [employer].  [employer]  Pediatrician List   [facility]  [address] [place], [place], [place] [address]  Phone: [phone]   [facility] (second location)  [address] [place], [place] [address]  Phone: [phone]   [facility]) [address] [place], [place], [place] [address] Phone: [phone]   [facility]  [address] [place], [place] [address]  Phone: [phone]  Pain Relief During Labor and Delivery Many things can cause pain during labor and delivery, including:  Pressure on bones and ligaments due to the baby moving through the pelvis.  Stretching of tissues due to the baby moving through the birth canal.  Muscle tension due to anxiety or nervousness.  The uterus tightening (contracting) and relaxing to help move the baby.  There are many ways to deal with the pain of labor and delivery. They include:  Taking prenatal classes. Taking these classes helps you know what to expect during your baby's birth. What you learn will increase your confidence and decrease your  anxiety.  Practicing relaxation techniques or doing relaxing activities, such as: ? Focused breathing. ? Meditation. ? Visualization. ? Aroma therapy. ? Listening to your favorite music. ? Hypnosis.  Taking a warm shower or bath (hydrotherapy). This may: ? Provide comfort and relaxation. ? Lessen your perception of pain. ? Decrease the amount of pain medicine needed. ? Decrease the length of labor.  Getting a massage or counterpressure on your back.  Applying warm packs or ice packs.  Changing positions often, moving around, or using a birthing ball.  Getting: ? Pain medicine through an IV or injection into a muscle. ? Pain medicine inserted into your spinal column. ? Injections of sterile water just under the skin on your lower back (intradermal injections). ? Laughing gas (nitrous oxide).  Discuss your pain control options with your health care provider during your prenatal visits. Explore the options offered by your hospital or birth center. What kinds of medicine are available? There are two kinds of medicines that can be used to relieve pain during labor and delivery:  Analgesics. These medicines decrease pain without causing you to lose feeling or the ability to move your muscles.  Anesthetics. These medicines block feeling in the body and can decrease your ability to move freely.  Both of these kinds of medicine can cause minor side effects, such as nausea, trouble concentrating, and sleepiness. They can also decrease the baby's heart rate before birth and affect the baby's breathing rate after birth. For this reason, health care providers are careful about when and how much medicine is given. What are specific medicines and procedures that provide pain relief? Local Anesthetics Local anesthetics are used to numb a small area of the body. They may be used along with another kind of anesthetic or used to numb the nerves of the vagina, cervix, and perineum during the  second stage of labor. General Anesthetics General anesthetics cause you to lose consciousness so you do not feel pain. They are usually only used for an emergency cesarean delivery. General anesthetics are given through an IV tube and a mask. Pudendal Block A pudendal block is a form of local anesthetic. It may be used to relieve the pain associated with pushing or stretching of the perineum at the time of delivery or to further numb the perineum. A pudendal block is done by injecting numbing medicine through the vaginal wall into a nerve in the pelvis. Epidural Analgesia Epidural analgesia is given through a flexible  IV catheter that is inserted into the lower back. Numbing medicine is delivered continuously to the area near your spinal column nerves (epidural space). After having this type of analgesia, you may be able to move your legs but you most likely will not be able to walk. Depending on the amount of medicine given, you may lose all feeling in the lower half of your body, or you may retain some level of sensation, including the urge to push. Epidural analgesia can be used to provide pain relief for a vaginal birth. Spinal Block A spinal block is similar to epidural analgesia, but the medicine is injected into the spinal fluid instead of the epidural space. A spinal block is only given once. It starts to relieve pain quickly, but the pain relief lasts only 1-6 hours. Spinal blocks can be used for cesarean deliveries. Combined Spinal-Epidural (CSE) Block A CSE block combines the effects of a spinal block and epidural analgesia. The spinal block works quickly to block all pain. The epidural analgesia provides continuous pain relief, even after the effects of the spinal block have worn off. This information is not intended to replace advice given to you by your health care provider. Make sure you discuss any questions you have with your health care provider. Document Released: 03/16/2009 Document  Revised: 05/06/2016 Document Reviewed: 04/20/2016 Elsevier Interactive Patient Education  [employer].

## 2018-04-03 LAB — RPR: RPR: NONREACTIVE

## 2018-04-03 LAB — CBC
Hematocrit: 33 % — ABNORMAL LOW (ref 34.0–46.6)
Hemoglobin: 11.5 g/dL (ref 11.1–15.9)
MCH: 31.4 pg (ref 26.6–33.0)
MCHC: 34.8 g/dL (ref 31.5–35.7)
MCV: 90 fL (ref 79–97)
Platelets: 119 10*3/uL — ABNORMAL LOW (ref 150–379)
RBC: 3.66 x10E6/uL — ABNORMAL LOW (ref 3.77–5.28)
RDW: 13.7 % (ref 12.3–15.4)
WBC: 10.3 10*3/uL (ref 3.4–10.8)

## 2018-04-03 LAB — GLUCOSE, 1 HOUR GESTATIONAL: GESTATIONAL DIABETES SCREEN: 124 mg/dL (ref 65–139)

## 2018-04-03 NOTE — Progress Notes (Signed)
[name]-Doing well, no questions or concerns. 28 week labs today. TDaP given. Blood transfusion consent reviewed and signed. Anticipatory guidance regarding course of prenatal care. Education topics addressed: intrapartum pain management options, pediatrician selection, and postpartum contraception; handouts given. Reviewed red flag symptoms and when to call. RTC x 2 weeks for [name] or sooner if needed.

## 2018-04-17 ENCOUNTER — Encounter: Payer: Self-pay | Admitting: Certified Nurse Midwife

## 2018-04-17 ENCOUNTER — Ambulatory Visit (INDEPENDENT_AMBULATORY_CARE_PROVIDER_SITE_OTHER): Payer: BLUE CROSS/BLUE SHIELD | Admitting: Certified Nurse Midwife

## 2018-04-17 VITALS — BP 107/64 | HR 97 | Wt 163.0 lb

## 2018-04-17 DIAGNOSIS — Z3483 Encounter for supervision of other normal pregnancy, third trimester: Secondary | ICD-10-CM

## 2018-04-17 LAB — POCT URINALYSIS DIPSTICK
Bilirubin, UA: NEGATIVE
Blood, UA: NEGATIVE
GLUCOSE UA: NEGATIVE
Ketones, UA: NEGATIVE
LEUKOCYTES UA: NEGATIVE
Nitrite, UA: NEGATIVE
Spec Grav, UA: 1.025 (ref 1.010–1.025)
Urobilinogen, UA: 0.2 E.U./dL
pH, UA: 6 (ref 5.0–8.0)

## 2018-04-17 NOTE — Patient Instructions (Signed)

## 2018-04-17 NOTE — Progress Notes (Signed)
[name], doing well. Glucose and CBC results reviewed with pt. She feels good fetal movement, denies contractions. Follow up in 2 wks.   Doreene Burke, CNM

## 2018-04-17 NOTE — Progress Notes (Signed)
Pt is here for an [name] visit. 

## 2018-05-01 ENCOUNTER — Ambulatory Visit (INDEPENDENT_AMBULATORY_CARE_PROVIDER_SITE_OTHER): Payer: BLUE CROSS/BLUE SHIELD | Admitting: Obstetrics and Gynecology

## 2018-05-01 VITALS — BP 113/62 | HR 102 | Wt 166.2 lb

## 2018-05-01 DIAGNOSIS — Z3493 Encounter for supervision of normal pregnancy, unspecified, third trimester: Secondary | ICD-10-CM | POA: Diagnosis not present

## 2018-05-01 LAB — POCT URINALYSIS DIPSTICK
Bilirubin, UA: NEGATIVE
Blood, UA: NEGATIVE
Glucose, UA: NEGATIVE
KETONES UA: NEGATIVE
NITRITE UA: NEGATIVE
PROTEIN UA: NEGATIVE
Spec Grav, UA: 1.01 (ref 1.010–1.025)
Urobilinogen, UA: 0.2 E.U./dL
pH, UA: 6 (ref 5.0–8.0)

## 2018-05-01 NOTE — Progress Notes (Signed)
[name]- pt is doing well 

## 2018-05-01 NOTE — Progress Notes (Signed)
[name]-doing well, has started childbirth class, breast feeding class next.

## 2018-05-05 ENCOUNTER — Encounter: Payer: Self-pay | Admitting: Certified Nurse Midwife

## 2018-05-15 ENCOUNTER — Ambulatory Visit (INDEPENDENT_AMBULATORY_CARE_PROVIDER_SITE_OTHER): Payer: BLUE CROSS/BLUE SHIELD | Admitting: Certified Nurse Midwife

## 2018-05-15 VITALS — BP 111/61 | HR 89 | Wt 167.5 lb

## 2018-05-15 DIAGNOSIS — Z3493 Encounter for supervision of normal pregnancy, unspecified, third trimester: Secondary | ICD-10-CM

## 2018-05-15 LAB — POCT URINALYSIS DIPSTICK
BILIRUBIN UA: NEGATIVE
Blood, UA: NEGATIVE
Glucose, UA: NEGATIVE
KETONES UA: NEGATIVE
Leukocytes, UA: NEGATIVE
NITRITE UA: NEGATIVE
PROTEIN UA: POSITIVE — AB
SPEC GRAV UA: 1.02 (ref 1.010–1.025)
UROBILINOGEN UA: 0.2 U/dL
pH, UA: 7 (ref 5.0–8.0)

## 2018-05-15 NOTE — Progress Notes (Signed)
Pt is here for an [name] visit. 

## 2018-05-15 NOTE — Progress Notes (Signed)
[name]-Doing well, baby shower was last weekend. Sample packing list given. Anticipatory guidance regarding course of prenatal care. Reviewed red flag symptoms and when to call. RTC x 1 week for 36 week cultures and [name] or sooner if needed.

## 2018-05-15 NOTE — Patient Instructions (Signed)
Common Medications Safe in Pregnancy  Acne:      Constipation:  Benzoyl Peroxide     Colace  Clindamycin      Dulcolax Suppository  Topica Erythromycin     Fibercon  Salicylic Acid      Metamucil         Miralax AVOID:        Senakot   Accutane    Cough:  Retin-A       Cough Drops  Tetracycline      Phenergan w/ Codeine if Rx  Minocycline      Robitussin (Plain & DM)  Antibiotics:     Crabs/Lice:  Ceclor       RID  Cephalosporins    AVOID:  E-Mycins      Kwell  Keflex  Macrobid/Macrodantin   Diarrhea:  Penicillin      Kao-Pectate  Zithromax      Imodium AD         PUSH FLUIDS AVOID:       Cipro     Fever:  Tetracycline      Tylenol (Regular or Extra  Minocycline       Strength)  Levaquin      Extra Strength-Do not          Exceed 8 tabs/24 hrs Caffeine:        <200mg/day (equiv. To 1 cup of coffee or  approx. 3 12 oz sodas)         Gas: Cold/Hayfever:       Gas-X  Benadryl      Mylicon  Claritin       Phazyme  **Claritin-D        Chlor-Trimeton    Headaches:  Dimetapp      ASA-Free Excedrin  Drixoral-Non-Drowsy     Cold Compress  Mucinex (Guaifenasin)     Tylenol (Regular or Extra  Sudafed/Sudafed-12 Hour     Strength)  **Sudafed PE Pseudoephedrine   Tylenol Cold & Sinus     Vicks Vapor Rub  Zyrtec  **AVOID if Problems With Blood Pressure         Heartburn: Avoid lying down for at least 1 hour after meals  Aciphex      Maalox     Rash:  Milk of Magnesia     Benadryl    Mylanta       1% Hydrocortisone Cream  Pepcid  Pepcid Complete   Sleep Aids:  Prevacid      Ambien   Prilosec       Benadryl  Rolaids       Chamomile Tea  Tums (Limit 4/day)     Unisom  Zantac       Tylenol PM         Warm milk-add vanilla or  Hemorrhoids:       Sugar for taste  Anusol/Anusol H.C.  (RX: Analapram 2.5%)  Sugar Substitutes:  Hydrocortisone OTC     Ok in moderation  Preparation H      Tucks        Vaseline lotion applied to tissue with  wiping    Herpes:     Throat:  Acyclovir      Oragel  Famvir  Valtrex     Vaccines:         Flu Shot Leg Cramps:       *Gardasil  Benadryl      Hepatitis A         Hepatitis B Nasal Spray:         Pneumovax  Saline Nasal Spray     Polio Booster         Tetanus Nausea:       Tuberculosis test or PPD  Vitamin B6 25 mg TID   AVOID:    Dramamine      *Gardasil  Emetrol       Live Poliovirus  Ginger Root 250 mg QID    MMR (measles, mumps &  High Complex Carbs @ Bedtime    rebella)  Sea Bands-Accupressure    Varicella (Chickenpox)  Unisom 1/2 tab TID     *No known complications           If received before Pain:         Known pregnancy;   Darvocet       Resume series after  Lortab        Delivery  Percocet    Yeast:   Tramadol      Femstat  Tylenol 3      Gyne-lotrimin  Ultram       Monistat  Vicodin           MISC:         All Sunscreens           Hair Coloring/highlights          Insect Repellant's          (Including DEET)         Mystic Tans Vaginal Delivery Vaginal delivery means that you will give birth by pushing your baby out of your birth canal (vagina). A team of health care providers will help you before, during, and after vaginal delivery. Birth experiences are unique for every woman and every pregnancy, and birth experiences vary depending on where you choose to give birth. What should I do to prepare for my baby's birth? Before your baby is born, it is important to talk with your health care provider about:  Your labor and delivery preferences. These may include: ? Medicines that you may be given. ? How you will manage your pain. This might include non-medical pain relief techniques or injectable pain relief such as epidural analgesia. ? How you and your baby will be monitored during labor and delivery. ? Who may be in the labor and delivery room with you. ? Your feelings about surgical delivery of your baby (cesarean delivery, or C-section) if this becomes  necessary. ? Your feelings about receiving donated blood through an IV tube (blood transfusion) if this becomes necessary.  Whether you are able: ? To take pictures or videos of the birth. ? To eat during labor and delivery. ? To move around, walk, or change positions during labor and delivery.  What to expect after your baby is born, such as: ? Whether delayed umbilical cord clamping and cutting is offered. ? Who will care for your baby right after birth. ? Medicines or tests that may be recommended for your baby. ? Whether breastfeeding is supported in your hospital or birth center. ? How long you will be in the hospital or birth center.  How any medical conditions you have may affect your baby or your labor and delivery experience.  To prepare for your baby's birth, you should also:  Attend all of your health care visits before delivery (prenatal visits) as recommended by your health care provider. This is important.  Prepare your home for your baby's arrival. Make sure that you have: ? Diapers. ? Baby clothing. ? Feeding equipment. ? Safe   sleeping arrangements for you and your baby.  Install a car seat in your vehicle. Have your car seat checked by a certified car seat installer to make sure that it is installed safely.  Think about who will help you with your new baby at home for at least the first several weeks after delivery.  What can I expect when I arrive at the birth center or hospital? Once you are in labor and have been admitted into the hospital or birth center, your health care provider may:  Review your pregnancy history and any concerns you have.  Insert an IV tube into one of your veins. This is used to give you fluids and medicines.  Check your blood pressure, pulse, temperature, and heart rate (vital signs).  Check whether your bag of water (amniotic sac) has broken (ruptured).  Talk with you about your birth plan and discuss pain control  options.  Monitoring Your health care provider may monitor your contractions (uterine monitoring) and your baby's heart rate (fetal monitoring). You may need to be monitored:  Often, but not continuously (intermittently).  All the time or for long periods at a time (continuously). Continuous monitoring may be needed if: ? You are taking certain medicines, such as medicine to relieve pain or make your contractions stronger. ? You have pregnancy or labor complications.  Monitoring may be done by:  Placing a special stethoscope or a handheld monitoring device on your abdomen to check your baby's heartbeat, and feeling your abdomen for contractions. This method of monitoring does not continuously record your baby's heartbeat or your contractions.  Placing monitors on your abdomen (external monitors) to record your baby's heartbeat and the frequency and length of contractions. You may not have to wear external monitors all the time.  Placing monitors inside of your uterus (internal monitors) to record your baby's heartbeat and the frequency, length, and strength of your contractions. ? Your health care provider may use internal monitors if he or she needs more information about the strength of your contractions or your baby's heart rate. ? Internal monitors are put in place by passing a thin, flexible wire through your vagina and into your uterus. Depending on the type of monitor, it may remain in your uterus or on your baby's head until birth. ? Your health care provider will discuss the benefits and risks of internal monitoring with you and will ask for your permission before inserting the monitors.  Telemetry. This is a type of continuous monitoring that can be done with external or internal monitors. Instead of having to stay in bed, you are able to move around during telemetry. Ask your health care provider if telemetry is an option for you.  Physical exam Your health care provider may  perform a physical exam. This may include:  Checking whether your baby is positioned: ? With the head toward your vagina (head-down). This is most common. ? With the head toward the top of your uterus (head-up or breech). If your baby is in a breech position, your health care provider may try to turn your baby to a head-down position so you can deliver vaginally. If it does not seem that your baby can be born vaginally, your provider may recommend surgery to deliver your baby. In rare cases, you may be able to deliver vaginally if your baby is head-up (breech delivery). ? Lying sideways (transverse). Babies that are lying sideways cannot be delivered vaginally.  Checking your cervix to determine: ? Whether it   is thinning out (effacing). ? Whether it is opening up (dilating). ? How low your baby has moved into your birth canal.  What are the three stages of labor and delivery?  Normal labor and delivery is divided into the following three stages: Stage 1  Stage 1 is the longest stage of labor, and it can last for hours or days. Stage 1 includes: ? Early labor. This is when contractions may be irregular, or regular and mild. Generally, early labor contractions are more than 10 minutes apart. ? Active labor. This is when contractions get longer, more regular, more frequent, and more intense. ? The transition phase. This is when contractions happen very close together, are very intense, and may last longer than during any other part of labor.  Contractions generally feel mild, infrequent, and irregular at first. They get stronger, more frequent (about every 2-3 minutes), and more regular as you progress from early labor through active labor and transition.  Many women progress through stage 1 naturally, but you may need help to continue making progress. If this happens, your health care provider may talk with you about: ? Rupturing your amniotic sac if it has not ruptured yet. ? Giving you  medicine to help make your contractions stronger and more frequent.  Stage 1 ends when your cervix is completely dilated to 4 inches (10 cm) and completely effaced. This happens at the end of the transition phase. Stage 2  Once your cervix is completely effaced and dilated to 4 inches (10 cm), you may start to feel an urge to push. It is common for the body to naturally take a rest before feeling the urge to push, especially if you received an epidural or certain other pain medicines. This rest period may last for up to 1-2 hours, depending on your unique labor experience.  During stage 2, contractions are generally less painful, because pushing helps relieve contraction pain. Instead of contraction pain, you may feel stretching and burning pain, especially when the widest part of your baby's head passes through the vaginal opening (crowning).  Your health care provider will closely monitor your pushing progress and your baby's progress through the vagina during stage 2.  Your health care provider may massage the area of skin between your vaginal opening and anus (perineum) or apply warm compresses to your perineum. This helps it stretch as the baby's head starts to crown, which can help prevent perineal tearing. ? In some cases, an incision may be made in your perineum (episiotomy) to allow the baby to pass through the vaginal opening. An episiotomy helps to make the opening of the vagina larger to allow more room for the baby to fit through.  It is very important to breathe and focus so your health care provider can control the delivery of your baby's head. Your health care provider may have you decrease the intensity of your pushing, to help prevent perineal tearing.  After delivery of your baby's head, the shoulders and the rest of the body generally deliver very quickly and without difficulty.  Once your baby is delivered, the umbilical cord may be cut right away, or this may be delayed for  1-2 minutes, depending on your baby's health. This may vary among health care providers, hospitals, and birth centers.  If you and your baby are healthy enough, your baby may be placed on your chest or abdomen to help maintain the baby's temperature and to help you bond with each other. Some mothers and   babies start breastfeeding at this time. Your health care team will dry your baby and help keep your baby warm during this time.  Your baby may need immediate care if he or she: ? Showed signs of distress during labor. ? Has a medical condition. ? Was born too early (prematurely). ? Had a bowel movement before birth (meconium). ? Shows signs of difficulty transitioning from being inside the uterus to being outside of the uterus. If you are planning to breastfeed, your health care team will help you begin a feeding. Stage 3  The third stage of labor starts immediately after the birth of your baby and ends after you deliver the placenta. The placenta is an organ that develops during pregnancy to provide oxygen and nutrients to your baby in the womb.  Delivering the placenta may require some pushing, and you may have mild contractions. Breastfeeding can stimulate contractions to help you deliver the placenta.  After the placenta is delivered, your uterus should tighten (contract) and become firm. This helps to stop bleeding in your uterus. To help your uterus contract and to control bleeding, your health care provider may: ? Give you medicine by injection, through an IV tube, by mouth, or through your rectum (rectally). ? Massage your abdomen or perform a vaginal exam to remove any blood clots that are left in your uterus. ? Empty your bladder by placing a thin, flexible tube (catheter) into your bladder. ? Encourage you to breastfeed your baby. After labor is over, you and your baby will be monitored closely to ensure that you are both healthy until you are ready to go home. Your health care team  will teach you how to care for yourself and your baby. This information is not intended to replace advice given to you by your health care provider. Make sure you discuss any questions you have with your health care provider. Document Released: 09/06/2008 Document Revised: 06/17/2016 Document Reviewed: 12/13/2015 Elsevier Interactive Patient Education  2018 [employer].

## 2018-05-22 ENCOUNTER — Ambulatory Visit (INDEPENDENT_AMBULATORY_CARE_PROVIDER_SITE_OTHER): Payer: BLUE CROSS/BLUE SHIELD | Admitting: Certified Nurse Midwife

## 2018-05-22 ENCOUNTER — Encounter: Payer: Self-pay | Admitting: Certified Nurse Midwife

## 2018-05-22 VITALS — BP 120/65 | HR 100 | Wt 171.1 lb

## 2018-05-22 DIAGNOSIS — Z3493 Encounter for supervision of normal pregnancy, unspecified, third trimester: Secondary | ICD-10-CM

## 2018-05-22 LAB — POCT URINALYSIS DIPSTICK
Bilirubin, UA: NEGATIVE
Blood, UA: NEGATIVE
GLUCOSE UA: NEGATIVE
Leukocytes, UA: NEGATIVE
Nitrite, UA: NEGATIVE
Protein, UA: POSITIVE — AB
SPEC GRAV UA: 1.025 (ref 1.010–1.025)
Urobilinogen, UA: 0.2 E.U./dL
pH, UA: 6 (ref 5.0–8.0)

## 2018-05-22 NOTE — Progress Notes (Signed)
[name],doing well,reports occasional braxton hicks. GBS/GCC today. Anticipatory guidance on remainder of prenatal visits schedule. SVE request today.  Red flag symptoms reviwered. Patient verbalized understanding. RTC 1 week.  Shanika Creacy,SNM/Kalaysia Demonbreun,CNM

## 2018-05-22 NOTE — Progress Notes (Signed)
Pt is here for an [name] visit. 

## 2018-05-22 NOTE — Patient Instructions (Signed)
Group B Streptococcus Infection During Pregnancy Group B Streptococcus (GBS) is a type of bacteria (Streptococcus agalactiae) that is often found in healthy people, commonly in the rectum, vagina, and intestines. In people who are healthy and not pregnant, the bacteria rarely cause serious illness or complications. However, women who test positive for GBS during pregnancy can pass the bacteria to their baby during childbirth, which can cause serious infection in the baby after birth. Women with GBS may also have infections during their pregnancy or immediately after childbirth, such as such as urinary tract infections (UTIs) or infections of the uterus (uterine infections). Having GBS also increases a woman's risk of complications during pregnancy, such as early (preterm) labor or delivery, miscarriage, or stillbirth. Routine testing (screening) for GBS is recommended for all pregnant women. What increases the risk? You may have a higher risk for GBS infection during pregnancy if you had one during a past pregnancy. What are the signs or symptoms? In most cases, GBS infection does not cause symptoms in pregnant women. Signs and symptoms of a possible GBS-related infection may include:  Labor starting before the 37th week of pregnancy.  A UTI or bladder infection, which may cause: ? Fever. ? Pain or burning during urination. ? Frequent urination.  Fever during labor, along with: ? Bad-smelling discharge. ? Uterine tenderness. ? Rapid heartbeat in the mother, baby, or both.  Rare but serious symptoms of a possible GBS-related infection in women include:  Blood infection (septicemia). This may cause fever, chills, or confusion.  Lung infection (pneumonia). This may cause fever, chills, cough, rapid breathing, difficulty breathing, or chest pain.  Bone, joint, skin, or soft tissue infection.  How is this diagnosed? You may be screened for GBS between week 35 and week 37 of your pregnancy. If  you have symptoms of preterm labor, you may be screened earlier. This condition is diagnosed based on lab test results from:  A swab of fluid from the vagina and rectum.  A urine sample.  How is this treated? This condition is treated with antibiotic medicine. When you go into labor, or as soon as your water breaks (your membranes rupture), you will be given antibiotics through an IV tube. Antibiotics will continue until after you give birth. If you are having a cesarean delivery, you do not need antibiotics unless your membranes have already ruptured. Follow these instructions at home:  Take over-the-counter and prescription medicines only as told by your health care provider.  Take your antibiotic medicine as told by your health care provider. Do not stop taking the antibiotic even if you start to feel better.  Keep all pre-birth (prenatal) visits and follow-up visits as told by your health care provider. This is important. Contact a health care provider if:  You have pain or burning when you urinate.  You have to urinate frequently.  You have a fever or chills.  You develop a bad-smelling vaginal discharge. Get help right away if:  Your membranes rupture.  You go into labor.  You have severe pain in your abdomen.  You have difficulty breathing.  You have chest pain. This information is not intended to replace advice given to you by your health care provider. Make sure you discuss any questions you have with your health care provider. Document Released: 03/06/2008 Document Revised: 06/24/2016 Document Reviewed: 06/23/2016 Elsevier Interactive Patient Education  2018 Elsevier Inc.  

## 2018-05-24 LAB — GC/CHLAMYDIA PROBE AMP
Chlamydia trachomatis, NAA: NEGATIVE
Neisseria gonorrhoeae by PCR: NEGATIVE

## 2018-05-24 LAB — STREP GP B NAA: STREP GROUP B AG: NEGATIVE

## 2018-05-26 ENCOUNTER — Encounter (INDEPENDENT_AMBULATORY_CARE_PROVIDER_SITE_OTHER): Payer: Self-pay

## 2018-05-29 ENCOUNTER — Ambulatory Visit (INDEPENDENT_AMBULATORY_CARE_PROVIDER_SITE_OTHER): Payer: BLUE CROSS/BLUE SHIELD | Admitting: Certified Nurse Midwife

## 2018-05-29 ENCOUNTER — Encounter: Payer: Self-pay | Admitting: Certified Nurse Midwife

## 2018-05-29 VITALS — BP 118/66 | HR 103 | Wt 171.4 lb

## 2018-05-29 DIAGNOSIS — Z3493 Encounter for supervision of normal pregnancy, unspecified, third trimester: Secondary | ICD-10-CM

## 2018-05-29 LAB — POCT URINALYSIS DIPSTICK
Bilirubin, UA: NEGATIVE
Blood, UA: NEGATIVE
Glucose, UA: NEGATIVE
KETONES UA: NEGATIVE
Leukocytes, UA: NEGATIVE
NITRITE UA: NEGATIVE
PH UA: 5 (ref 5.0–8.0)
PROTEIN UA: POSITIVE — AB
Spec Grav, UA: 1.025 (ref 1.010–1.025)
UROBILINOGEN UA: 0.2 U/dL

## 2018-05-29 NOTE — Progress Notes (Signed)
[name]-Doing well, no questions or concerns. Discussed home labor preparation techniques. Reviewed red flag symptoms and when to call. RTC x 1 week for [name] or sooner if needed.

## 2018-05-29 NOTE — Progress Notes (Signed)
Pt is here for an [name] visit. No c/o. 

## 2018-05-29 NOTE — Patient Instructions (Signed)
Vaginal Delivery Vaginal delivery means that you will give birth by pushing your baby out of your birth canal (vagina). A team of health care providers will help you before, during, and after vaginal delivery. Birth experiences are unique for every woman and every pregnancy, and birth experiences vary depending on where you choose to give birth. What should I do to prepare for my baby's birth? Before your baby is born, it is important to talk with your health care provider about:  Your labor and delivery preferences. These may include: ? Medicines that you may be given. ? How you will manage your pain. This might include non-medical pain relief techniques or injectable pain relief such as epidural analgesia. ? How you and your baby will be monitored during labor and delivery. ? Who may be in the labor and delivery room with you. ? Your feelings about surgical delivery of your baby (cesarean delivery, or C-section) if this becomes necessary. ? Your feelings about receiving donated blood through an IV tube (blood transfusion) if this becomes necessary.  Whether you are able: ? To take pictures or videos of the birth. ? To eat during labor and delivery. ? To move around, walk, or change positions during labor and delivery.  What to expect after your baby is born, such as: ? Whether delayed umbilical cord clamping and cutting is offered. ? Who will care for your baby right after birth. ? Medicines or tests that may be recommended for your baby. ? Whether breastfeeding is supported in your hospital or birth center. ? How long you will be in the hospital or birth center.  How any medical conditions you have may affect your baby or your labor and delivery experience.  To prepare for your baby's birth, you should also:  Attend all of your health care visits before delivery (prenatal visits) as recommended by your health care provider. This is important.  Prepare your home for your baby's  arrival. Make sure that you have: ? Diapers. ? Baby clothing. ? Feeding equipment. ? Safe sleeping arrangements for you and your baby.  Install a car seat in your vehicle. Have your car seat checked by a certified car seat installer to make sure that it is installed safely.  Think about who will help you with your new baby at home for at least the first several weeks after delivery.  What can I expect when I arrive at the birth center or hospital? Once you are in labor and have been admitted into the hospital or birth center, your health care provider may:  Review your pregnancy history and any concerns you have.  Insert an IV tube into one of your veins. This is used to give you fluids and medicines.  Check your blood pressure, pulse, temperature, and heart rate (vital signs).  Check whether your bag of water (amniotic sac) has broken (ruptured).  Talk with you about your birth plan and discuss pain control options.  Monitoring Your health care provider may monitor your contractions (uterine monitoring) and your baby's heart rate (fetal monitoring). You may need to be monitored:  Often, but not continuously (intermittently).  All the time or for long periods at a time (continuously). Continuous monitoring may be needed if: ? You are taking certain medicines, such as medicine to relieve pain or make your contractions stronger. ? You have pregnancy or labor complications.  Monitoring may be done by:  Placing a special stethoscope or a handheld monitoring device on your abdomen to   check your baby's heartbeat, and feeling your abdomen for contractions. This method of monitoring does not continuously record your baby's heartbeat or your contractions.  Placing monitors on your abdomen (external monitors) to record your baby's heartbeat and the frequency and length of contractions. You may not have to wear external monitors all the time.  Placing monitors inside of your uterus  (internal monitors) to record your baby's heartbeat and the frequency, length, and strength of your contractions. ? Your health care provider may use internal monitors if he or she needs more information about the strength of your contractions or your baby's heart rate. ? Internal monitors are put in place by passing a thin, flexible wire through your vagina and into your uterus. Depending on the type of monitor, it may remain in your uterus or on your baby's head until birth. ? Your health care provider will discuss the benefits and risks of internal monitoring with you and will ask for your permission before inserting the monitors.  Telemetry. This is a type of continuous monitoring that can be done with external or internal monitors. Instead of having to stay in bed, you are able to move around during telemetry. Ask your health care provider if telemetry is an option for you.  Physical exam Your health care provider may perform a physical exam. This may include:  Checking whether your baby is positioned: ? With the head toward your vagina (head-down). This is most common. ? With the head toward the top of your uterus (head-up or breech). If your baby is in a breech position, your health care provider may try to turn your baby to a head-down position so you can deliver vaginally. If it does not seem that your baby can be born vaginally, your provider may recommend surgery to deliver your baby. In rare cases, you may be able to deliver vaginally if your baby is head-up (breech delivery). ? Lying sideways (transverse). Babies that are lying sideways cannot be delivered vaginally.  Checking your cervix to determine: ? Whether it is thinning out (effacing). ? Whether it is opening up (dilating). ? How low your baby has moved into your birth canal.  What are the three stages of labor and delivery?  Normal labor and delivery is divided into the following three stages: Stage 1  Stage 1 is the  longest stage of labor, and it can last for hours or days. Stage 1 includes: ? Early labor. This is when contractions may be irregular, or regular and mild. Generally, early labor contractions are more than 10 minutes apart. ? Active labor. This is when contractions get longer, more regular, more frequent, and more intense. ? The transition phase. This is when contractions happen very close together, are very intense, and may last longer than during any other part of labor.  Contractions generally feel mild, infrequent, and irregular at first. They get stronger, more frequent (about every 2-3 minutes), and more regular as you progress from early labor through active labor and transition.  Many women progress through stage 1 naturally, but you may need help to continue making progress. If this happens, your health care provider may talk with you about: ? Rupturing your amniotic sac if it has not ruptured yet. ? Giving you medicine to help make your contractions stronger and more frequent.  Stage 1 ends when your cervix is completely dilated to 4 inches (10 cm) and completely effaced. This happens at the end of the transition phase. Stage 2  Once   your cervix is completely effaced and dilated to 4 inches (10 cm), you may start to feel an urge to push. It is common for the body to naturally take a rest before feeling the urge to push, especially if you received an epidural or certain other pain medicines. This rest period may last for up to 1-2 hours, depending on your unique labor experience.  During stage 2, contractions are generally less painful, because pushing helps relieve contraction pain. Instead of contraction pain, you may feel stretching and burning pain, especially when the widest part of your baby's head passes through the vaginal opening (crowning).  Your health care provider will closely monitor your pushing progress and your baby's progress through the vagina during stage 2.  Your  health care provider may massage the area of skin between your vaginal opening and anus (perineum) or apply warm compresses to your perineum. This helps it stretch as the baby's head starts to crown, which can help prevent perineal tearing. ? In some cases, an incision may be made in your perineum (episiotomy) to allow the baby to pass through the vaginal opening. An episiotomy helps to make the opening of the vagina larger to allow more room for the baby to fit through.  It is very important to breathe and focus so your health care provider can control the delivery of your baby's head. Your health care provider may have you decrease the intensity of your pushing, to help prevent perineal tearing.  After delivery of your baby's head, the shoulders and the rest of the body generally deliver very quickly and without difficulty.  Once your baby is delivered, the umbilical cord may be cut right away, or this may be delayed for 1-2 minutes, depending on your baby's health. This may vary among health care providers, hospitals, and birth centers.  If you and your baby are healthy enough, your baby may be placed on your chest or abdomen to help maintain the baby's temperature and to help you bond with each other. Some mothers and babies start breastfeeding at this time. Your health care team will dry your baby and help keep your baby warm during this time.  Your baby may need immediate care if he or she: ? Showed signs of distress during labor. ? Has a medical condition. ? Was born too early (prematurely). ? Had a bowel movement before birth (meconium). ? Shows signs of difficulty transitioning from being inside the uterus to being outside of the uterus. If you are planning to breastfeed, your health care team will help you begin a feeding. Stage 3  The third stage of labor starts immediately after the birth of your baby and ends after you deliver the placenta. The placenta is an organ that develops  during pregnancy to provide oxygen and nutrients to your baby in the womb.  Delivering the placenta may require some pushing, and you may have mild contractions. Breastfeeding can stimulate contractions to help you deliver the placenta.  After the placenta is delivered, your uterus should tighten (contract) and become firm. This helps to stop bleeding in your uterus. To help your uterus contract and to control bleeding, your health care provider may: ? Give you medicine by injection, through an IV tube, by mouth, or through your rectum (rectally). ? Massage your abdomen or perform a vaginal exam to remove any blood clots that are left in your uterus. ? Empty your bladder by placing a thin, flexible tube (catheter) into your bladder. ? Encourage   you to breastfeed your baby. After labor is over, you and your baby will be monitored closely to ensure that you are both healthy until you are ready to go home. Your health care team will teach you how to care for yourself and your baby. This information is not intended to replace advice given to you by your health care provider. Make sure you discuss any questions you have with your health care provider. Document Released: 09/06/2008 Document Revised: 06/17/2016 Document Reviewed: 12/13/2015 Elsevier Interactive Patient Education  2018 Elsevier Inc.  

## 2018-05-30 ENCOUNTER — Observation Stay
Admission: EM | Admit: 2018-05-30 | Discharge: 2018-05-30 | Disposition: A | Discharge status: Home / Self Care | Payer: BLUE CROSS/BLUE SHIELD | Attending: Certified Nurse Midwife | Admitting: Certified Nurse Midwife

## 2018-05-30 ENCOUNTER — Telehealth: Payer: Self-pay

## 2018-05-30 ENCOUNTER — Encounter: Payer: Self-pay | Admitting: Certified Nurse Midwife

## 2018-05-30 ENCOUNTER — Telehealth: Payer: Self-pay | Admitting: Certified Nurse Midwife

## 2018-05-30 DIAGNOSIS — Z3A37 37 weeks gestation of pregnancy: Secondary | ICD-10-CM

## 2018-05-30 DIAGNOSIS — O26893 Other specified pregnancy related conditions, third trimester: Secondary | ICD-10-CM | POA: Insufficient documentation

## 2018-05-30 DIAGNOSIS — Z3403 Encounter for supervision of normal first pregnancy, third trimester: Principal | ICD-10-CM | POA: Insufficient documentation

## 2018-05-30 LAB — ROM PLUS (ARMC ONLY): Rom Plus: NEGATIVE

## 2018-05-30 NOTE — Telephone Encounter (Signed)
Pt went to L&D

## 2018-05-30 NOTE — Telephone Encounter (Signed)
The patient sent a MyChart message reading  "i went to L&D and they said it was not my water breaking. Is there anyway someone at the office could check to see how far dilated or effaced I am. The nurse in L&D said a midwife would come check but then discharged me. So I am unaware on the progression I've made."  The patient needs a call back to confirm what she should do moving forward. Please advise.

## 2018-05-30 NOTE — Telephone Encounter (Signed)
The patient sent a MyChart message stating that she has been leaking clear fluid since last night, I spoke with Jasmine DecemberSharon and I was advised to inform the patient to go over to L&D as soon as she can. I called and LVM for patient and I also sent a MyChart message advising her to do the same. No other information disclosed. Please advise.

## 2018-05-30 NOTE — OB Triage Note (Signed)
  L&D OB Triage Note  SUBJECTIVE Stephanie Walker is a 22 y.o. G1P0000 female at 6135w1d, EDD Estimated Date of Delivery: 06/19/18 who presented to triage with complaints of leaking of fluid @ 0900. She denies vaginal bleeding and has irregular contractions. She feels good fetal movement.   OB History  Gravida Para Term Preterm AB Living  1 0 0 0 0 0  SAB TAB Ectopic Multiple Live Births  0 0 0 0 0    # Outcome Date GA Lbr Len/2nd Weight Sex Delivery Anes PTL Lv  1 Current             Medications Prior to Admission  Medication Sig Dispense Refill Last Dose  . prenatal vitamin w/FE, FA (NATACHEW) 29-1 MG CHEW chewable tablet Chew 1 tablet by mouth daily at 12 noon.   Taking     OBJECTIVE  Nursing Evaluation:   BP 124/74 (BP Location: Right Arm)   Pulse (!) 111   Temp 98.3 F (36.8 C) (Oral)   Resp 18   Ht 5\' 2"  (1.575 m)   Wt 171 lb (77.6 kg)   LMP 09/12/2017 (Approximate)   BMI 31.28 kg/m    Findings:  Reactive NST  NST was performed and has been reviewed by me.  NST INTERPRETATION: Category I  Mode: External Baseline Rate (A): 135 bpm Variability: Moderate Accelerations: 15 x 15 Decelerations: Variable x1     Contraction Frequency (min): (adjusted)  At the beginning of strip Variability appears to be increased. RN states that patient had her cell phone on her stomach and was laughing. Continued observation variability is Moderate.  ASSESSMENT Impression:  1.  Pregnancy:  G1P0000 at 935w1d , EDD Estimated Date of Delivery: 06/19/18 2.  NST:  Category I  PLAN 1. Reassurance given 2. Discharge home with standard labor precautions given to return to L&D or call the office for problems. 3. Continue routine prenatal care.  Doreene BurkeAnnie Veronika Heard, CNM

## 2018-05-30 NOTE — OB Triage Note (Signed)
Presents with complaint of leaking fluid that started around 9 am .

## 2018-05-30 NOTE — Telephone Encounter (Signed)
Please tell Gearldine BienenstockBrandy that I was the one caring for her today while she was at the hospital. That I am aware that she was there. There was no indication to check her cervix because she was not contracting regularly. The rupture test we did confirmed that she was not ruptured. Let her know that we can check her cervix when she comes in for her next scheduled appointment and that I looked at the monitor strip and saw that the baby looked great.   Thanks,  Pattricia BossAnnie

## 2018-06-05 ENCOUNTER — Encounter: Payer: Self-pay | Admitting: Certified Nurse Midwife

## 2018-06-05 ENCOUNTER — Ambulatory Visit (INDEPENDENT_AMBULATORY_CARE_PROVIDER_SITE_OTHER): Payer: BLUE CROSS/BLUE SHIELD | Admitting: Certified Nurse Midwife

## 2018-06-05 VITALS — BP 116/77 | HR 96 | Wt 171.4 lb

## 2018-06-05 DIAGNOSIS — Z3493 Encounter for supervision of normal pregnancy, unspecified, third trimester: Secondary | ICD-10-CM | POA: Diagnosis not present

## 2018-06-05 LAB — POCT URINALYSIS DIPSTICK
Bilirubin, UA: NEGATIVE
Glucose, UA: NEGATIVE
Leukocytes, UA: NEGATIVE
NITRITE UA: NEGATIVE
PH UA: 6 (ref 5.0–8.0)
PROTEIN UA: POSITIVE — AB
Urobilinogen, UA: 0.2 E.U./dL

## 2018-06-05 NOTE — Patient Instructions (Signed)
Braxton Hicks Contractions °Contractions of the uterus can occur throughout pregnancy, but they are not always a sign that you are in labor. You may have practice contractions called Braxton Hicks contractions. These false labor contractions are sometimes confused with true labor. °What are Braxton Hicks contractions? °Braxton Hicks contractions are tightening movements that occur in the muscles of the uterus before labor. Unlike true labor contractions, these contractions do not result in opening (dilation) and thinning of the cervix. Toward the end of pregnancy (32-34 weeks), Braxton Hicks contractions can happen more often and may become stronger. These contractions are sometimes difficult to tell apart from true labor because they can be very uncomfortable. You should not feel embarrassed if you go to the hospital with false labor. °Sometimes, the only way to tell if you are in true labor is for your health care provider to look for changes in the cervix. The health care provider will do a physical exam and may monitor your contractions. If you are not in true labor, the exam should show that your cervix is not dilating and your water has not broken. °If there are other health problems associated with your pregnancy, it is completely safe for you to be sent home with false labor. You may continue to have Braxton Hicks contractions until you go into true labor. °How to tell the difference between true labor and false labor °True labor °· Contractions last 30-70 seconds. °· Contractions become very regular. °· Discomfort is usually felt in the top of the uterus, and it spreads to the lower abdomen and low back. °· Contractions do not go away with walking. °· Contractions usually become more intense and increase in frequency. °· The cervix dilates and gets thinner. °False labor °· Contractions are usually shorter and not as strong as true labor contractions. °· Contractions are usually irregular. °· Contractions  are often felt in the front of the lower abdomen and in the groin. °· Contractions may go away when you walk around or change positions while lying down. °· Contractions get weaker and are shorter-lasting as time goes on. °· The cervix usually does not dilate or become thin. °Follow these instructions at home: °· Take over-the-counter and prescription medicines only as told by your health care provider. °· Keep up with your usual exercises and follow other instructions from your health care provider. °· Eat and drink lightly if you think you are going into labor. °· If Braxton Hicks contractions are making you uncomfortable: °? Change your position from lying down or resting to walking, or change from walking to resting. °? Sit and rest in a tub of warm water. °? Drink enough fluid to keep your urine pale yellow. Dehydration may cause these contractions. °? Do slow and deep breathing several times an hour. °· Keep all follow-up prenatal visits as told by your health care provider. This is important. °Contact a health care provider if: °· You have a fever. °· You have continuous pain in your abdomen. °Get help right away if: °· Your contractions become stronger, more regular, and closer together. °· You have fluid leaking or gushing from your vagina. °· You pass blood-tinged mucus (bloody show). °· You have bleeding from your vagina. °· You have low back pain that you never had before. °· You feel your baby’s head pushing down and causing pelvic pressure. °· Your baby is not moving inside you as much as it used to. °Summary °· Contractions that occur before labor are called Braxton   Hicks contractions, false labor, or practice contractions. °· Braxton Hicks contractions are usually shorter, weaker, farther apart, and less regular than true labor contractions. True labor contractions usually become progressively stronger and regular and they become more frequent. °· Manage discomfort from Braxton Hicks contractions by  changing position, resting in a warm bath, drinking plenty of water, or practicing deep breathing. °This information is not intended to replace advice given to you by your health care provider. Make sure you discuss any questions you have with your health care provider. °Document Released: 04/13/2017 Document Revised: 04/13/2017 Document Reviewed: 04/13/2017 °Elsevier Interactive Patient Education © 2018 Elsevier Inc. ° °

## 2018-06-05 NOTE — Progress Notes (Signed)
Pt is here for an [name] visit. She would like to be checked. Is very uncomfortable.

## 2018-06-05 NOTE — Progress Notes (Signed)
[name] doing well. No complaints. Feels good movement. Labor precautions reviewed. Discussed induction by 41 wks if labor does not occur. She verbalizes and agrees to plan. Follow up 1 wk   Doreene BurkeAnnie Chazlyn Cude, CNM

## 2018-06-06 ENCOUNTER — Encounter: Payer: Self-pay | Admitting: Certified Nurse Midwife

## 2018-06-07 ENCOUNTER — Encounter: Payer: Self-pay | Admitting: Obstetrics and Gynecology

## 2018-06-08 ENCOUNTER — Telehealth: Payer: Self-pay | Admitting: Certified Nurse Midwife

## 2018-06-08 NOTE — Telephone Encounter (Signed)
HIPPA approved voicemail left on identified line instructing patient to call back.    Gunnar BullaJenkins Michelle Blyss Lugar, CNM [facility]

## 2018-06-12 ENCOUNTER — Ambulatory Visit (INDEPENDENT_AMBULATORY_CARE_PROVIDER_SITE_OTHER): Payer: BLUE CROSS/BLUE SHIELD | Admitting: Obstetrics and Gynecology

## 2018-06-12 VITALS — BP 122/79 | HR 96 | Wt 171.1 lb

## 2018-06-12 DIAGNOSIS — Z3493 Encounter for supervision of normal pregnancy, unspecified, third trimester: Secondary | ICD-10-CM | POA: Diagnosis not present

## 2018-06-12 LAB — POCT URINALYSIS DIPSTICK
Bilirubin, UA: NEGATIVE
Blood, UA: NEGATIVE
Glucose, UA: NEGATIVE
Ketones, UA: NEGATIVE
NITRITE UA: NEGATIVE
PROTEIN UA: POSITIVE — AB
Spec Grav, UA: 1.015 (ref 1.010–1.025)
Urobilinogen, UA: 0.2 E.U./dL
pH, UA: 6 (ref 5.0–8.0)

## 2018-06-12 NOTE — Progress Notes (Signed)
[name]-labor precautions and PIH s/s reviewed. [facility]

## 2018-06-12 NOTE — Progress Notes (Signed)
[name]- pelvic pressure, she is having some back pain

## 2018-06-13 ENCOUNTER — Encounter: Payer: Self-pay | Admitting: *Deleted

## 2018-06-13 ENCOUNTER — Other Ambulatory Visit: Payer: Self-pay

## 2018-06-13 ENCOUNTER — Observation Stay
Admission: EM | Admit: 2018-06-13 | Discharge: 2018-06-13 | Disposition: A | Discharge status: Home / Self Care | Payer: BLUE CROSS/BLUE SHIELD | Attending: Obstetrics and Gynecology | Admitting: Obstetrics and Gynecology

## 2018-06-13 DIAGNOSIS — Z3A39 39 weeks gestation of pregnancy: Secondary | ICD-10-CM

## 2018-06-13 DIAGNOSIS — Z3403 Encounter for supervision of normal first pregnancy, third trimester: Secondary | ICD-10-CM | POA: Diagnosis not present

## 2018-06-13 DIAGNOSIS — O9989 Other specified diseases and conditions complicating pregnancy, childbirth and the puerperium: Secondary | ICD-10-CM | POA: Diagnosis present

## 2018-06-13 DIAGNOSIS — Z3490 Encounter for supervision of normal pregnancy, unspecified, unspecified trimester: Secondary | ICD-10-CM

## 2018-06-13 DIAGNOSIS — Z3480 Encounter for supervision of other normal pregnancy, unspecified trimester: Secondary | ICD-10-CM

## 2018-06-13 DIAGNOSIS — O4703 False labor before 37 completed weeks of gestation, third trimester: Secondary | ICD-10-CM | POA: Diagnosis not present

## 2018-06-13 NOTE — Progress Notes (Signed)
Pt discharged home. Discharged instructions reviewed,  Pt verbalized understanding. Will return on 06/17/18 for IOL.

## 2018-06-13 NOTE — OB Triage Note (Signed)
Pt arrived with complaints of leaking of fluid that started around 0100. Positive Fetal movement, no contractions and pt reports spotting. Nitrazine negative, no blood noted on glove or pad.

## 2018-06-15 ENCOUNTER — Encounter: Payer: Self-pay | Admitting: Certified Nurse Midwife

## 2018-06-15 ENCOUNTER — Other Ambulatory Visit: Payer: Self-pay | Admitting: Certified Nurse Midwife

## 2018-06-15 ENCOUNTER — Ambulatory Visit (INDEPENDENT_AMBULATORY_CARE_PROVIDER_SITE_OTHER): Payer: BLUE CROSS/BLUE SHIELD | Admitting: Certified Nurse Midwife

## 2018-06-15 VITALS — BP 118/72 | HR 91 | Wt 171.8 lb

## 2018-06-15 DIAGNOSIS — R809 Proteinuria, unspecified: Secondary | ICD-10-CM

## 2018-06-15 DIAGNOSIS — Z3493 Encounter for supervision of normal pregnancy, unspecified, third trimester: Secondary | ICD-10-CM

## 2018-06-15 LAB — POCT URINALYSIS DIPSTICK
Bilirubin, UA: NEGATIVE
GLUCOSE UA: NEGATIVE
Ketones, UA: NEGATIVE
Nitrite, UA: NEGATIVE
Odor: NEGATIVE
PH UA: 6 (ref 5.0–8.0)
PROTEIN UA: POSITIVE — AB
Spec Grav, UA: 1.025 (ref 1.010–1.025)
Urobilinogen, UA: 0.2 E.U./dL

## 2018-06-15 LAB — CBC
Hematocrit: 36.1 % (ref 34.0–46.6)
Hemoglobin: 12.7 g/dL (ref 11.1–15.9)
MCH: 31 pg (ref 26.6–33.0)
MCHC: 35.2 g/dL (ref 31.5–35.7)
MCV: 88 fL (ref 79–97)
PLATELETS: 152 10*3/uL (ref 150–450)
RBC: 4.1 x10E6/uL (ref 3.77–5.28)
RDW: 13.4 % (ref 12.3–15.4)
WBC: 9.4 10*3/uL (ref 3.4–10.8)

## 2018-06-15 LAB — COMPREHENSIVE METABOLIC PANEL
A/G RATIO: 1.6 (ref 1.2–2.2)
ALT: 13 IU/L (ref 0–32)
AST: 14 IU/L (ref 0–40)
Albumin: 3.4 g/dL — ABNORMAL LOW (ref 3.5–5.5)
Alkaline Phosphatase: 198 IU/L — ABNORMAL HIGH (ref 39–117)
BUN/Creatinine Ratio: 8 — ABNORMAL LOW (ref 9–23)
BUN: 5 mg/dL — AB (ref 6–20)
Bilirubin Total: <0.2 mg/dL (ref 0.0–1.2)
CALCIUM: 8.8 mg/dL (ref 8.7–10.2)
CO2: 19 mmol/L — AB (ref 20–29)
CREATININE: 0.64 mg/dL (ref 0.57–1.00)
Chloride: 104 mmol/L (ref 96–106)
GFR calc Af Amer: 146 mL/min/{1.73_m2} (ref >59)
GFR calc non Af Amer: 127 mL/min/{1.73_m2} (ref >59)
GLUCOSE: 77 mg/dL (ref 65–99)
Globulin, Total: 2.1 g/dL (ref 1.5–4.5)
POTASSIUM: 3.9 mmol/L (ref 3.5–5.2)
Sodium: 137 mmol/L (ref 134–144)
TOTAL PROTEIN: 5.5 g/dL — AB (ref 6.0–8.5)

## 2018-06-15 LAB — PROTEIN / CREATININE RATIO, URINE
Creatinine, Urine: 146.6 mg/dL
PROTEIN UR: 52.8 mg/dL
PROTEIN/CREAT RATIO: 360 mg/g{creat} — AB (ref 0–200)

## 2018-06-15 NOTE — Progress Notes (Signed)
[name], doing well. No complaints. Feels good fetal movement. Denies visual changes, headaches, epigastric pain . Reflexes +1 bilaterally, no clonus. Discussed plan of care. Lab collected today. At this point induction is not indicated. Will evaluate labs and call patient. SVE 2-3/60/-2. Reviewed risks of induction. Pt given option to proceed with induction. She will let me know once I call with labs. Follow up for induction or in 1 wk.  Doreene BurkeAnnie Jeffrie Lofstrom, CNM

## 2018-06-15 NOTE — Progress Notes (Signed)
[name]- no complaints. Declines cervix check. IOL- 06/17/2018.

## 2018-06-15 NOTE — Patient Instructions (Signed)
Labor Induction Labor induction is when steps are taken to cause a pregnant woman to begin the labor process. Most women go into labor on their own between 37 weeks and 42 weeks of the pregnancy. When this does not happen or when there is a medical need, methods may be used to induce labor. Labor induction causes a pregnant woman's uterus to contract. It also causes the cervix to soften (ripen), open (dilate), and thin out (efface). Usually, labor is not induced before 39 weeks of the pregnancy unless there is a problem with the baby or mother. Before inducing labor, your health care provider will consider a number of factors, including the following:  The medical condition of you and the baby.  How many weeks along you are.  The status of the baby's lung maturity.  The condition of the cervix.  The position of the baby. What are the reasons for labor induction? Labor may be induced for the following reasons:  The health of the baby or mother is at risk.  The pregnancy is overdue by 1 week or more.  The water breaks but labor does not start on its own.  The mother has a health condition or serious illness, such as high blood pressure, infection, placental abruption, or diabetes.  The amniotic fluid amounts are low around the baby.  The baby is distressed. Convenience or wanting the baby to be born on a certain date is not a reason for inducing labor. What methods are used for labor induction? Several methods of labor induction may be used, such as:  Prostaglandin medicine. This medicine causes the cervix to dilate and ripen. The medicine will also start contractions. It can be taken by mouth or by inserting a suppository into the vagina.  Inserting a thin tube (catheter) with a balloon on the end into the vagina to dilate the cervix. Once inserted, the balloon is expanded with water, which causes the cervix to open.  Stripping the membranes. Your health care provider separates  amniotic sac tissue from the cervix, causing the cervix to be stretched and causing the release of a hormone called progesterone. This may cause the uterus to contract. It is often done during an office visit. You will be sent home to wait for the contractions to begin. You will then come in for an induction.  Breaking the water. Your health care provider makes a hole in the amniotic sac using a small instrument. Once the amniotic sac breaks, contractions should begin. This may still take hours to see an effect.  Medicine to trigger or strengthen contractions. This medicine is given through an IV access tube inserted into a vein in your arm. All of the methods of induction, besides stripping the membranes, will be done in the hospital. Induction is done in the hospital so that you and the baby can be carefully monitored. How long does it take for labor to be induced? Some inductions can take up to 2-3 days. Depending on the cervix, it usually takes less time. It takes longer when you are induced early in the pregnancy or if this is your first pregnancy. If a mother is still pregnant and the induction has been going on for 2-3 days, either the mother will be sent home or a cesarean delivery will be needed. What are the risks associated with labor induction? Some of the risks of induction include:  Changes in fetal heart rate, such as too high, too low, or erratic.  Fetal distress.    Chance of infection for the mother and baby.  Increased chance of having a cesarean delivery.  Breaking off (abruption) of the placenta from the uterus (rare).  Uterine rupture (very rare). When induction is needed for medical reasons, the benefits of induction may outweigh the risks. What are some reasons for not inducing labor? Labor induction should not be done if:  It is shown that your baby does not tolerate labor.  You have had previous surgeries on your uterus, such as a myomectomy or the removal of  fibroids.  Your placenta lies very low in the uterus and blocks the opening of the cervix (placenta previa).  Your baby is not in a head-down position.  The umbilical cord drops down into the birth canal in front of the baby. This could cut off the baby's blood and oxygen supply.  You have had a previous cesarean delivery.  There are unusual circumstances, such as the baby being extremely premature. This information is not intended to replace advice given to you by your health care provider. Make sure you discuss any questions you have with your health care provider. Document Released: 04/19/2007 Document Revised: 05/05/2016 Document Reviewed: 06/27/2013 Elsevier Interactive Patient Education  2017 Elsevier Inc.  

## 2018-06-17 ENCOUNTER — Inpatient Hospital Stay
Admission: EM | Admit: 2018-06-17 | Discharge: 2018-06-19 | DRG: 807 | Disposition: A | Discharge status: Home / Self Care | Payer: BLUE CROSS/BLUE SHIELD | Attending: Certified Nurse Midwife | Admitting: Certified Nurse Midwife

## 2018-06-17 ENCOUNTER — Inpatient Hospital Stay: Payer: BLUE CROSS/BLUE SHIELD | Admitting: Anesthesiology

## 2018-06-17 ENCOUNTER — Other Ambulatory Visit: Payer: Self-pay

## 2018-06-17 DIAGNOSIS — Z3A39 39 weeks gestation of pregnancy: Secondary | ICD-10-CM

## 2018-06-17 DIAGNOSIS — O1214 Gestational proteinuria, complicating childbirth: Principal | ICD-10-CM | POA: Diagnosis present

## 2018-06-17 DIAGNOSIS — Z283 Underimmunization status: Secondary | ICD-10-CM

## 2018-06-17 DIAGNOSIS — Z2839 Other underimmunization status: Secondary | ICD-10-CM

## 2018-06-17 DIAGNOSIS — O9989 Other specified diseases and conditions complicating pregnancy, childbirth and the puerperium: Secondary | ICD-10-CM

## 2018-06-17 LAB — CBC
HCT: 35.5 % (ref 35.0–47.0)
HEMOGLOBIN: 12.6 g/dL (ref 12.0–16.0)
MCH: 31.7 pg (ref 26.0–34.0)
MCHC: 35.6 g/dL (ref 32.0–36.0)
MCV: 89 fL (ref 80.0–100.0)
Platelets: 148 10*3/uL — ABNORMAL LOW (ref 150–440)
RBC: 3.98 MIL/uL (ref 3.80–5.20)
RDW: 13.4 % (ref 11.5–14.5)
WBC: 9.8 10*3/uL (ref 3.6–11.0)

## 2018-06-17 LAB — TYPE AND SCREEN
ABO/RH(D): A POS
ANTIBODY SCREEN: NEGATIVE

## 2018-06-17 MED ORDER — OXYCODONE-ACETAMINOPHEN 5-325 MG PO TABS: 2.0000 | ORAL_TABLET | ORAL | Status: DC | PRN

## 2018-06-17 MED ORDER — SOD CITRATE-CITRIC ACID 500-334 MG/5ML PO SOLN: 30.0000 mL | ORAL | Status: DC | PRN

## 2018-06-17 MED ORDER — PHENYLEPHRINE 40 MCG/ML (10ML) SYRINGE FOR IV PUSH (FOR BLOOD PRESSURE SUPPORT)
80.0000 ug | PREFILLED_SYRINGE | INTRAVENOUS | Status: DC | PRN
  Filled 2018-06-17: qty 5

## 2018-06-17 MED ORDER — OXYCODONE-ACETAMINOPHEN 5-325 MG PO TABS: 1.0000 | ORAL_TABLET | ORAL | Status: DC | PRN

## 2018-06-17 MED ORDER — COCONUT OIL OIL
1.0000 "application " | TOPICAL_OIL | Status: DC | PRN
  Filled 2018-06-17: qty 120

## 2018-06-17 MED ORDER — OXYTOCIN BOLUS FROM INFUSION
500.0000 mL | Freq: Once | INTRAVENOUS | Status: AC
  Administered 2018-06-17: 500 mL via INTRAVENOUS

## 2018-06-17 MED ORDER — ONDANSETRON HCL 4 MG/2ML IJ SOLN: 4.0000 mg | Freq: Four times a day (QID) | INTRAMUSCULAR | Status: DC | PRN

## 2018-06-17 MED ORDER — ONDANSETRON HCL 4 MG/2ML IJ SOLN: 4.0000 mg | INTRAMUSCULAR | Status: DC | PRN

## 2018-06-17 MED ORDER — LIDOCAINE HCL (PF) 1 % IJ SOLN
INTRAMUSCULAR | Status: DC | PRN
  Administered 2018-06-17 (×2): 1 mL via INTRADERMAL

## 2018-06-17 MED ORDER — SODIUM CHLORIDE 0.9 % IV SOLN
INTRAVENOUS | Status: DC | PRN
  Administered 2018-06-17 (×2): 5 mL via EPIDURAL

## 2018-06-17 MED ORDER — METHYLERGONOVINE MALEATE 0.2 MG PO TABS: 0.2000 mg | ORAL_TABLET | ORAL | Status: DC | PRN

## 2018-06-17 MED ORDER — BENZOCAINE-MENTHOL 20-0.5 % EX AERO
1.0000 "application " | INHALATION_SPRAY | CUTANEOUS | Status: DC | PRN
  Administered 2018-06-17: 1 via TOPICAL
  Filled 2018-06-17: qty 56

## 2018-06-17 MED ORDER — MISOPROSTOL 200 MCG PO TABS
ORAL_TABLET | ORAL | Status: AC
  Filled 2018-06-17: qty 4

## 2018-06-17 MED ORDER — TERBUTALINE SULFATE 1 MG/ML IJ SOLN
INTRAMUSCULAR | Status: AC
  Administered 2018-06-17: 0.25 mg
  Filled 2018-06-17: qty 1

## 2018-06-17 MED ORDER — BUTORPHANOL TARTRATE 1 MG/ML IJ SOLN: 1.0000 mg | INTRAMUSCULAR | Status: DC | PRN

## 2018-06-17 MED ORDER — OXYTOCIN 40 UNITS IN LACTATED RINGERS INFUSION - SIMPLE MED
2.5000 [IU]/h | INTRAVENOUS | Status: DC
  Administered 2018-06-17: 2.5 [IU]/h via INTRAVENOUS

## 2018-06-17 MED ORDER — FERROUS SULFATE 325 (65 FE) MG PO TABS
325.0000 mg | ORAL_TABLET | Freq: Every day | ORAL | Status: DC
  Administered 2018-06-18 – 2018-06-19 (×2): 325 mg via ORAL
  Filled 2018-06-17 (×2): qty 1

## 2018-06-17 MED ORDER — PRENATAL MULTIVITAMIN CH
1.0000 | ORAL_TABLET | Freq: Every day | ORAL | Status: DC
  Administered 2018-06-18: 1 via ORAL
  Filled 2018-06-17: qty 1

## 2018-06-17 MED ORDER — EPHEDRINE 5 MG/ML INJ
10.0000 mg | INTRAVENOUS | Status: DC | PRN
  Filled 2018-06-17: qty 2

## 2018-06-17 MED ORDER — METHYLERGONOVINE MALEATE 0.2 MG/ML IJ SOLN: 0.2000 mg | INTRAMUSCULAR | Status: DC | PRN

## 2018-06-17 MED ORDER — DOCUSATE SODIUM 100 MG PO CAPS
100.0000 mg | ORAL_CAPSULE | Freq: Two times a day (BID) | ORAL | Status: DC
  Administered 2018-06-17 – 2018-06-19 (×4): 100 mg via ORAL
  Filled 2018-06-17 (×4): qty 1

## 2018-06-17 MED ORDER — TETANUS-DIPHTH-ACELL PERTUSSIS 5-2.5-18.5 LF-MCG/0.5 IM SUSP: 0.5000 mL | Freq: Once | INTRAMUSCULAR | Status: DC

## 2018-06-17 MED ORDER — LIDOCAINE-EPINEPHRINE (PF) 1.5 %-1:200000 IJ SOLN
INTRAMUSCULAR | Status: DC | PRN
  Administered 2018-06-17: 3 mL via EPIDURAL

## 2018-06-17 MED ORDER — OXYTOCIN 40 UNITS IN LACTATED RINGERS INFUSION - SIMPLE MED
INTRAVENOUS | Status: AC
  Filled 2018-06-17: qty 1000

## 2018-06-17 MED ORDER — DIBUCAINE 1 % RE OINT
1.0000 | TOPICAL_OINTMENT | RECTAL | Status: DC | PRN
Start: 2018-06-17 — End: 2018-06-19

## 2018-06-17 MED ORDER — OXYTOCIN 10 UNIT/ML IJ SOLN
INTRAMUSCULAR | Status: AC
  Filled 2018-06-17: qty 2

## 2018-06-17 MED ORDER — TERBUTALINE SULFATE 1 MG/ML IJ SOLN: 0.2500 mg | Freq: Once | INTRAMUSCULAR | Status: DC | PRN

## 2018-06-17 MED ORDER — LACTATED RINGERS IV SOLN
INTRAVENOUS | Status: DC
  Administered 2018-06-17 (×3): via INTRAVENOUS

## 2018-06-17 MED ORDER — FENTANYL 2.5 MCG/ML W/ROPIVACAINE 0.15% IN NS 100 ML EPIDURAL (ARMC)
EPIDURAL | Status: AC
  Filled 2018-06-17: qty 100

## 2018-06-17 MED ORDER — WITCH HAZEL-GLYCERIN EX PADS
1.0000 | MEDICATED_PAD | CUTANEOUS | Status: DC | PRN
Start: 2018-06-17 — End: 2018-06-19

## 2018-06-17 MED ORDER — LIDOCAINE HCL (PF) 1 % IJ SOLN
INTRAMUSCULAR | Status: AC
  Filled 2018-06-17: qty 30

## 2018-06-17 MED ORDER — SENNOSIDES-DOCUSATE SODIUM 8.6-50 MG PO TABS
2.0000 | ORAL_TABLET | ORAL | Status: DC
  Administered 2018-06-19: 2 via ORAL
  Filled 2018-06-17 (×2): qty 2

## 2018-06-17 MED ORDER — LACTATED RINGERS IV SOLN: 500.0000 mL | Freq: Once | INTRAVENOUS | Status: DC

## 2018-06-17 MED ORDER — LIDOCAINE HCL (PF) 1 % IJ SOLN: 30.0000 mL | INTRAMUSCULAR | Status: DC | PRN

## 2018-06-17 MED ORDER — MISOPROSTOL 25 MCG QUARTER TABLET
50.0000 ug | ORAL_TABLET | ORAL | Status: DC | PRN
  Administered 2018-06-17: 50 ug via VAGINAL
  Filled 2018-06-17: qty 1

## 2018-06-17 MED ORDER — ONDANSETRON HCL 4 MG PO TABS: 4.0000 mg | ORAL_TABLET | ORAL | Status: DC | PRN

## 2018-06-17 MED ORDER — ACETAMINOPHEN 325 MG PO TABS: 650.0000 mg | ORAL_TABLET | ORAL | Status: DC | PRN

## 2018-06-17 MED ORDER — LACTATED RINGERS IV SOLN: 500.0000 mL | INTRAVENOUS | Status: DC | PRN

## 2018-06-17 MED ORDER — FENTANYL 2.5 MCG/ML W/ROPIVACAINE 0.15% IN NS 100 ML EPIDURAL (ARMC)
12.0000 mL/h | EPIDURAL | Status: DC
  Administered 2018-06-17: 12 mL/h via EPIDURAL

## 2018-06-17 MED ORDER — OXYTOCIN 10 UNIT/ML IJ SOLN: 10.0000 [IU] | Freq: Once | INTRAMUSCULAR | Status: DC

## 2018-06-17 MED ORDER — MISOPROSTOL 25 MCG QUARTER TABLET
25.0000 ug | ORAL_TABLET | Freq: Once | ORAL | Status: AC
  Administered 2018-06-17: 25 ug via VAGINAL

## 2018-06-17 MED ORDER — AMMONIA AROMATIC IN INHA
RESPIRATORY_TRACT | Status: AC
  Filled 2018-06-17: qty 10

## 2018-06-17 MED ORDER — MISOPROSTOL 25 MCG QUARTER TABLET
ORAL_TABLET | ORAL | Status: AC
  Filled 2018-06-17: qty 1

## 2018-06-17 MED ORDER — SIMETHICONE 80 MG PO CHEW: 80.0000 mg | CHEWABLE_TABLET | ORAL | Status: DC | PRN

## 2018-06-17 MED ORDER — DIPHENHYDRAMINE HCL 50 MG/ML IJ SOLN: 12.5000 mg | INTRAMUSCULAR | Status: DC | PRN

## 2018-06-17 MED ORDER — IBUPROFEN 600 MG PO TABS
600.0000 mg | ORAL_TABLET | Freq: Four times a day (QID) | ORAL | Status: DC
  Administered 2018-06-17 – 2018-06-18 (×3): 600 mg via ORAL
  Filled 2018-06-17 (×4): qty 1

## 2018-06-17 NOTE — Progress Notes (Signed)
LABOR NOTE   Stephanie Walker 22 y.o.GP@ at 9548w5d Not in labor.  SUBJECTIVE:  Comfortable.  OBJECTIVE:  BP (!) 105/56 (BP Location: Right Arm)   Pulse 80   Temp 98.2 F (36.8 C) (Oral)   Resp 16   Ht 5\' 2"  (1.575 m)   Wt 171 lb (77.6 kg)   LMP 09/12/2017 (Approximate)   BMI 31.28 kg/m  No intake/output data recorded.  She has shown cervical change. CERVIX: 4 cm:  70%:   -2:   posterior:   firm SVE:   Dilation: 3 Effacement (%): 60 Station: -2 Exam by:: Stephanie Walker CNM CONTRACTIONS: 1-2 mild with irritability  FHR: Fetal heart tracing reviewed. Baseline: 115 bpm, Variability: Good {> 6 bpm), Accelerations: Reactive and Decelerations: Absent Category I   Analgesia: Labor support without medications  Labs: Lab Results  Component Value Date   WBC 9.8 06/17/2018   HGB 12.6 06/17/2018   HCT 35.5 06/17/2018   MCV 89.0 06/17/2018   PLT 148 (L) 06/17/2018    ASSESSMENT: 1) Labor curve reviewed.       Progress: Not in labor. and AROM     Membranes: intact, meconium light      2)  Second dose 25 mcg Cytotec placed vaginally   Active Problems:   Labor and delivery, indication for care   PLAN: continue present management  Stephanie Walker, CNM  06/17/2018 12:23 PM

## 2018-06-17 NOTE — Progress Notes (Signed)
LABOR NOTE   Stephanie Walker 22 y.o.GP@ at 5567w5d Active phase labor.  SUBJECTIVE:  Patient feeling comfortable with epidural. OBJECTIVE:  BP 122/75   Pulse 90   Temp 98.2 F (36.8 C) (Oral)   Resp 16   Ht 5\' 2"  (1.575 m)   Wt 171 lb (77.6 kg)   LMP 09/12/2017 (Approximate)   SpO2 99%   BMI 31.28 kg/m  No intake/output data recorded.  She has not shown cervical change. CERVIX: 6cm:  80%:   -2:   mid position:   firm SVE:   Dilation: 6 Effacement (%): 80 Station: -1, -2 Exam by:: NB CONTRACTIONS: regular, every 2-4 minutes at 60-90 FHR: Fetal heart tracing reviewed. Baseline: 115 bpm, Variability: Good {> 6 bpm), Accelerations: Reactive and Decelerations: late decel x1 while laying on back after epidural  Category II Analgesia: Epidural  Labs: Lab Results  Component Value Date   WBC 9.8 06/17/2018   HGB 12.6 06/17/2018   HCT 35.5 06/17/2018   MCV 89.0 06/17/2018   PLT 148 (L) 06/17/2018    ASSESSMENT: 1) Labor curve reviewed.       Progress: Active phase labor.     Membranes: ruptured, light meconium      2)  Placed IUPC        Active Problems:   Labor and delivery, indication for care   PLAN: Start pitocin to establish adequate contraction with MVU 200-250.    Stephanie Walker,CNM/ Stephanie Walker,SNM 06/17/2018 3:13 PM

## 2018-06-17 NOTE — Anesthesia Procedure Notes (Signed)
Epidural Patient location during procedure: OB Start time: 06/17/2018 2:21 PM End time: 06/17/2018 2:31 PM  Staffing Anesthesiologist: Piscitello, Cleda MccreedyJoseph K, MD Performed: anesthesiologist   Preanesthetic Checklist Completed: patient identified, site marked, surgical consent, pre-op evaluation, timeout performed, IV checked, risks and benefits discussed and monitors and equipment checked  Epidural Patient position: sitting Prep: ChloraPrep Patient monitoring: heart rate, continuous pulse ox and blood pressure Approach: midline Location: L4-L5 Injection technique: LOR saline  Needle:  Needle type: Tuohy  Needle gauge: 17 G Needle length: 9 cm and 9 Needle insertion depth: 6 cm Catheter type: closed end flexible Catheter size: 19 Gauge Catheter at skin depth: 11 cm Test dose: negative and 1.5% lidocaine with Epi 1:200 K  Assessment Sensory level: T10 Events: blood not aspirated, injection not painful, no injection resistance, negative IV test and no paresthesia  Additional Notes 2 attempts Pt. Evaluated and documentation done after procedure finished. Patient identified. Risks/Benefits/Options discussed with patient including but not limited to bleeding, infection, nerve damage, paralysis, failed block, incomplete pain control, headache, blood pressure changes, nausea, vomiting, reactions to medication both or allergic, itching and postpartum back pain. Confirmed with bedside nurse the patient's most recent platelet count. Confirmed with patient that they are not currently taking any anticoagulation, have any bleeding history or any family history of bleeding disorders. Patient expressed understanding and wished to proceed. All questions were answered. Sterile technique was used throughout the entire procedure. Please see nursing notes for vital signs. Test dose was given through epidural catheter and negative prior to continuing to dose epidural or start infusion. Warning signs of high  block given to the patient including shortness of breath, tingling/numbness in hands, complete motor block, or any concerning symptoms with instructions to call for help. Patient was given instructions on fall risk and not to get out of bed. All questions and concerns addressed with instructions to call with any issues or inadequate analgesia.   Patient tolerated the insertion well without immediate complications.Reason for block:procedure for pain

## 2018-06-17 NOTE — H&P (Addendum)
I have reviewed the record and concur with patient management and plan.  Hildred Laserherry, Noemy Hallmon, MD [facility]   History and Physical   HPI  Stephanie Walker is a 22 y.o. G1P0000 at 5590w5d Estimated Date of Delivery: 06/19/18 who is being admitted for elective  induction.   OB History  OB History  Gravida Para Term Preterm AB Living  1 0 0 0 0 0  SAB TAB Ectopic Multiple Live Births  0 0 0 0 0    # Outcome Date GA Lbr Len/2nd Weight Sex Delivery Anes PTL Lv  1 Current             PROBLEM LIST  Pregnancy complications or risks: Patient Active Problem List   Diagnosis Date Noted  . Pregnancy 06/13/2018  . Indication for care in labor or delivery 05/30/2018  . Labor and delivery, indication for care 05/30/2018  . Rubella non-immune status, antepartum 11/21/2017    Prenatal labs and studies: ABO, Rh: --/--/A POS (07/07 16100628) Antibody: NEG (07/07 0628) Rubella: <0.90 (12/07 1034) RPR: Non Reactive (04/22 1105)  HBsAg: Negative (12/07 1034)  HIV: Non Reactive (12/07 1034)  RUE:AVWUJWJXGBS:Negative (06/11 1434)   Past Medical History:  Diagnosis Date  . Acne   . Oligomenorrhea      Past Surgical History:  Procedure Laterality Date  . NO PAST SURGERIES       Medications    Current Discharge Medication List    CONTINUE these medications which have NOT CHANGED   Details  prenatal vitamin w/FE, FA (NATACHEW) 29-1 MG CHEW chewable tablet Chew 1 tablet by mouth daily at 12 noon.         Allergies  Patient has no known allergies.  Review of Systems  Respiratory: negative Cardiovascular: negative Gastrointestinal: negative Genitourinary:negative Integument/breast: negative Hematologic/lymphatic: negative Musculoskeletal:negative Neurological: negative Behavioral/Psych: negative Endocrine: negative Allergic/Immunologic: negative  Physical Exam  BP 118/64 (BP Location: Left Arm)   Pulse 87   Temp 98.4 F (36.9 C) (Oral)   Resp 16   Ht 5\' 2"  (1.575 m)    Wt 171 lb (77.6 kg)   LMP 09/12/2017 (Approximate)   BMI 31.28 kg/m   Lungs:  CTAB Cardio: RRR Abd: Soft, gravid, NT Presentation: cephalic EXT: No C/C/ No edema DTRs: 2+ B CERVIX: Dilation: 3 Effacement (%): 60 Station: -2 Presentation: Vertex Exam by:: Janee Mornhompson CNM   See Prenatal records for more detailed PE.     FHR:  Baseline: 125 bpm, Variability: Good {> 6 bpm), Accelerations: Reactive and Decelerations:  One occurence of prolonged decel x 3 minutes lying on her back.  Toco: Uterine Contractions: Occasional mild contractions with irritability    Test Results  Results for orders placed or performed during the hospital encounter of 06/17/18 (from the past 24 hour(s))  CBC     Status: Abnormal   Collection Time: 06/17/18  6:28 AM  Result Value Ref Range   WBC 9.8 3.6 - 11.0 K/uL   RBC 3.98 3.80 - 5.20 MIL/uL   Hemoglobin 12.6 12.0 - 16.0 g/dL   HCT 91.435.5 78.235.0 - 95.647.0 %   MCV 89.0 80.0 - 100.0 fL   MCH 31.7 26.0 - 34.0 pg   MCHC 35.6 32.0 - 36.0 g/dL   RDW 21.313.4 08.611.5 - 57.814.5 %   Platelets 148 (L) 150 - 440 K/uL  Type and screen     Status: None   Collection Time: 06/17/18  6:28 AM  Result Value Ref Range   ABO/RH(D) A POS  Antibody Screen NEG    Sample Expiration      06/20/2018 Performed at [facility], [address] [place], [place] [address]    Group B Strep negative P/C ratio-360 (06/15/18)  Assessment   G1P0000 at [redacted]w[redacted]d Estimated Date of Delivery: 06/19/18  The fetus is reassuring.   Patient Active Problem List   Diagnosis Date Noted  . Pregnancy 06/13/2018  . Indication for care in labor or delivery 05/30/2018  . Labor and delivery, indication for care 05/30/2018  . Rubella non-immune status, antepartum 11/21/2017    Plan  1. Admited to L&D :   Cytotec Induction 2. EFM -Category 2 3. Epidural if desired.  Stadol for IV pain until epidural requested. 4. Admission labs completed 5. Anticipate NSVD  Shanika Creacy,SNM/Annie  Thompson,CNM 06/17/2018 9:17 AM

## 2018-06-17 NOTE — OB Triage Note (Signed)
Patient came in from home for Scheduled Induction IOL for worsening proteinuria.

## 2018-06-17 NOTE — Lactation Note (Signed)
This note was copied from a baby's chart. Lactation Consultation Note  Patient Name: Stephanie Walker ZOXWR'UToday's Date: 06/17/2018 Reason for consult: Initial assessment;Primapara;Term Assisted mom with pillow support positioning Stephanie Walker on right breast in modified cross cradle hold skin to skin.  She latches with minimal assistance with perfectly flanged lips and wide open mouth.  Stephanie Walker immediately begins strong rhythmic sucking with occasional swallows.  Mom denies any nipple pain, but good strong tugs on the breast.  Reviewed supply and demand, routine newborn feeding patterns and normal course of lactation.  Mom had diet questions related to breast feeding which were addressed.  Lactation number written on white board for any questions, concerns or assistance.  Maternal Data Formula Feeding for Exclusion: No Has patient been taught Hand Expression?: Yes Does the patient have breastfeeding experience prior to this delivery?: No  Feeding Feeding Type: Breast Fed Length of feed: 20 min  LATCH Score Latch: Grasps breast easily, tongue down, lips flanged, rhythmical sucking.  Audible Swallowing: A few with stimulation  Type of Nipple: Everted at rest and after stimulation  Comfort (Breast/Nipple): Soft / non-tender  Hold (Positioning): Assistance needed to correctly position infant at breast and maintain latch.  LATCH Score: 8  Interventions Interventions: Breast feeding basics reviewed;Assisted with latch;Breast compression;Skin to skin;Adjust position;Breast massage;Support pillows;Position options  Lactation Tools Discussed/Used WIC Program: Yes(Also has [employer] insurance)   Consult Status Consult Status: Follow-up Follow-up type: Call as needed    Stephanie Walker, Stephanie Walker 06/17/2018, 10:27 PM

## 2018-06-17 NOTE — Anesthesia Preprocedure Evaluation (Signed)
Anesthesia Evaluation  Patient identified by MRN, date of birth, ID band Patient awake    Reviewed: Allergy & Precautions, H&P , NPO status , Patient's Chart, lab work & pertinent test results  History of Anesthesia Complications Negative for: history of anesthetic complications  Airway Mallampati: III  TM Distance: >3 FB Neck ROM: full    Dental  (+) Chipped   Pulmonary neg pulmonary ROS,           Cardiovascular Exercise Tolerance: Good (-) hypertensionnegative cardio ROS       Neuro/Psych    GI/Hepatic negative GI ROS,   Endo/Other    Renal/GU   negative genitourinary   Musculoskeletal   Abdominal   Peds  Hematology negative hematology ROS (+)   Anesthesia Other Findings Past Medical History: No date: Acne No date: Oligomenorrhea  Past Surgical History: No date: NO PAST SURGERIES  BMI    Body Mass Index:  31.28 kg/m      Reproductive/Obstetrics (+) Pregnancy                             Anesthesia Physical Anesthesia Plan  ASA: II  Anesthesia Plan: Epidural   Post-op Pain Management:    Induction:   PONV Risk Score and Plan:   Airway Management Planned:   Additional Equipment:   Intra-op Plan:   Post-operative Plan:   Informed Consent: I have reviewed the patients History and Physical, chart, labs and discussed the procedure including the risks, benefits and alternatives for the proposed anesthesia with the patient or authorized representative who has indicated his/her understanding and acceptance.     Plan Discussed with: Anesthesiologist  Anesthesia Plan Comments:         Anesthesia Quick Evaluation

## 2018-06-17 NOTE — Progress Notes (Signed)
LABOR NOTE   Stephanie ChristmasBrandy Walker 22 y.o.GP@ at 10837w5d Active phase labor.  SUBJECTIVE:  Comfortable with epidural  OBJECTIVE:  BP (!) 106/57   Pulse 68   Temp 98.8 F (37.1 C) (Axillary)   Resp 16   Ht 5\' 2"  (1.575 m)   Wt 171 lb (77.6 kg)   LMP 09/12/2017 (Approximate)   SpO2 99%   BMI 31.28 kg/m  Total I/O In: -  Out: 250 [Urine:250]  She has shown cervical change. CERVIX: 9cm:  90%:   -1:   mid position:   soft SVE:   Dilation: 9 Effacement (%): 90 Station: -1 Exam by:: AT CNM  CONTRACTIONS: regular, every 1-2 minutes FHR: Fetal heart tracing reviewed. Baseline: 120 bpm, Variability: Good {> 6 bpm), Accelerations: Reactive and Decelerations: Prolonged decel x 5 min with Tachysystole.  Category II FSE placed    Analgesia: Epidural  Labs: Lab Results  Component Value Date   WBC 9.8 06/17/2018   HGB 12.6 06/17/2018   HCT 35.5 06/17/2018   MCV 89.0 06/17/2018   PLT 148 (L) 06/17/2018    ASSESSMENT: 1) Labor curve reviewed.       Progress: AROM and Active phase labor.     Membranes: ruptured, meconium light      2)  FSE placed acceleration present with placement  Active Problems:   Labor and delivery, indication for care   PLAN: continue present management  Doreene BurkeAnnie Analissa Bayless, CNM . 06/17/2018 4:30 PM

## 2018-06-18 LAB — CBC
HEMATOCRIT: 33.4 % — AB (ref 35.0–47.0)
HEMOGLOBIN: 11.4 g/dL — AB (ref 12.0–16.0)
MCH: 30.4 pg (ref 26.0–34.0)
MCHC: 34.1 g/dL (ref 32.0–36.0)
MCV: 89.4 fL (ref 80.0–100.0)
Platelets: 129 10*3/uL — ABNORMAL LOW (ref 150–440)
RBC: 3.73 MIL/uL — ABNORMAL LOW (ref 3.80–5.20)
RDW: 13.1 % (ref 11.5–14.5)
WBC: 15 10*3/uL — ABNORMAL HIGH (ref 3.6–11.0)

## 2018-06-18 LAB — RPR: RPR: NONREACTIVE

## 2018-06-18 MED ORDER — IBUPROFEN 600 MG PO TABS
600.0000 mg | ORAL_TABLET | Freq: Four times a day (QID) | ORAL | Status: DC
  Administered 2018-06-18 – 2018-06-19 (×4): 600 mg via ORAL
  Filled 2018-06-18 (×3): qty 1

## 2018-06-18 NOTE — Lactation Note (Signed)
This note was copied from a baby's chart. Lactation Consultation Note  Patient Name: [name]'UToday's Date: 06/18/2018 Reason for consult: Follow-up assessment;Primapara   Maternal Data Formula Feeding for Exclusion: No Does the patient have breastfeeding experience prior to this delivery?: No  Feeding Feeding Type: (did not observe this feeding) Length of feed: 15 min  LATCH Score                   Interventions Interventions: Breast feeding basics reviewed;Coconut oil(encouraged to offer both breasts at a feeding)  Lactation Tools Discussed/Used     Consult Status Consult Status: PRN Date: 06/19/18 Follow-up type: In-patient    Dyann KiefMarsha D Gaynelle Pastrana 06/18/2018, 7:37 PM

## 2018-06-18 NOTE — Progress Notes (Signed)
Progress Note - Vaginal Delivery  Stephanie Walker is a 22 y.o. G1P1001 now PP day 1 s/p Vaginal, Spontaneous .   Subjective:  The patient reports no complaints, up ad lib, voiding and tolerating PO  Objective:  Vital signs in last 24 hours: Temp:  [98.1 F (36.7 C)-99.8 F (37.7 C)] 98.1 F (36.7 C) (07/08 0751) Pulse Rate:  [68-135] 90 (07/08 0751) Resp:  [14-20] 20 (07/08 0751) BP: (100-128)/(44-90) 101/67 (07/08 0751) SpO2:  [98 %-100 %] 99 % (07/08 0751)  Physical Exam:  General: alert, cooperative, appears stated age and no distress Lochia: appropriate Uterine Fundus: firm@ u-1 DVT Evaluation: No evidence of DVT seen on physical exam. No cords or calf tenderness. No significant calf/ankle edema.    Data Review Recent Labs    06/17/18 0628 06/18/18 0550  HGB 12.6 11.4*  HCT 35.5 33.4*    Assessment/Plan: Active Problems:   Labor and delivery, indication for care   Plan for discharge tomorrow  -- Continue routine PP care.    Stephanie Walker, CNM  06/18/2018 7:54 AM

## 2018-06-18 NOTE — Anesthesia Postprocedure Evaluation (Signed)
Anesthesia Post Note  Patient: Stephanie Walker  Procedure(s) Performed: AN AD HOC LABOR EPIDURAL  Patient location during evaluation: Mother Baby Anesthesia Type: Epidural Level of consciousness: awake and alert Pain management: pain level controlled Vital Signs Assessment: post-procedure vital signs reviewed and stable Respiratory status: spontaneous breathing, nonlabored ventilation and respiratory function stable Cardiovascular status: stable Postop Assessment: no headache, no backache and epidural receding Anesthetic complications: no     Last Vitals:  Vitals:   06/18/18 0615 06/18/18 0751  BP: 103/67 101/67  Pulse: 85 90  Resp: 18 20  Temp: 36.9 C 36.7 C  SpO2: 98% 99%    Last Pain:  Vitals:   06/18/18 0751  TempSrc: Oral  PainSc:                  Jules SchickLogan,  Therron Sells P

## 2018-06-18 NOTE — Discharge Summary (Signed)
Physician Obstetric Discharge Summary  Patient ID: Stephanie Walker MRN: [id number] DOB/AGE: Mar 04, 1996 22 y.o.   Date of Admission: 06/13/2018  Date of Discharge: 06/13/2018  Admitting Diagnosis: Observation at 4438w1d     Discharge Diagnosis: No other diagnosis   Antepartum Procedures: NST and Nitrazine    Brief Hospital Course   L&D OB Triage Note  Stephanie ChristmasBrandy Jorgenson is a 22 y.o. G1P0000 female at 3438w1d, EDD Estimated Date of Delivery: 06/19/18 who presented to triage for complaints of leakage of clear fluid.  She was evaluated by the nurses with no significant findings for spontaneous rupture of membranes, labor or fetal distress. Vital signs stable. An NST was performed and has been reviewed by CNM.   NST INTERPRETATION: Indications: rule out uterine contractions  Mode: External Baseline Rate (A): 115 bpm Variability: Moderate Accelerations: 15 x 15 Decelerations: None     Contraction Frequency (min): occasional  Impression: reactive   Plan: NST performed was reviewed and was found to be reactive. She was discharged home with bleeding/labor precautions.  Continue routine prenatal care. Follow up with CNM as previously scheduled.    Discharge Instructions: Per After Visit Summary.  Activity:Refer to After Visit Summary   Diet: Regular  Medications: Allergies as of 06/13/2018   No Known Allergies     Medication List    ASK your doctor about these medications   prenatal vitamin w/FE, FA 29-1 MG Chew chewable tablet Chew 1 tablet by mouth daily at 12 noon.      Discharged Condition: stable  Discharged to: home    Gunnar BullaJenkins Michelle Shamyra Farias, CNM [facility], [facility]

## 2018-06-18 NOTE — Plan of Care (Signed)
Temp max 99.8 orally; tolerating regular diet well; good fluid intake; voiding; saline lock intact; site clear; fundus firm; small amount rubra lochia; boyfriend at bedside and attentive; no c/o pain, however, scheduled motrin given po every 6 hours; breastfeeding with good technique observed.

## 2018-06-19 MED ORDER — MEASLES, MUMPS & RUBELLA VAC ~~LOC~~ INJ
0.5000 mL | INJECTION | Freq: Once | SUBCUTANEOUS | Status: DC
  Filled 2018-06-19: qty 0.5

## 2018-06-19 NOTE — Discharge Summary (Signed)
Discharge Summary  Date of Admission: 06/17/2018  Date of Discharge: 06/19/2018  Admitting Diagnosis: Induction of labor at 8923w5d  Mode of Delivery: normal spontaneous vaginal delivery                 Discharge Diagnosis: No other diagnosis   Intrapartum Procedures: epidural   Post partum procedures: none  Complications: 2nd degree perineal laceration                      Discharge Day SOAP Note:  Progress Note - Vaginal Delivery  Stephanie Walker is a 22 y.o. G1P1001 now PP day 2 s/p Vaginal, Spontaneous . Delivery was uncomplicated  Subjective  The patient has the following complaints: has no unusual complaints  Pain is controlled with current medications.   Patient is urinating without difficulty.  She is ambulating well.    Objective  Vital signs: BP 108/75 (BP Location: Right Arm)   Pulse 81   Temp 98 F (36.7 C) (Oral)   Resp 20   Ht 5\' 2"  (1.575 m)   Wt 171 lb (77.6 kg)   LMP 09/12/2017 (Approximate)   SpO2 98%   Breastfeeding? Unknown   BMI 31.28 kg/m   Physical Exam: Gen: NAD Fundus Fundal Tone: Firm  Lochia Amount: Small  Perineum Appearance: Approximated, Edematous     Data Review Labs: CBC Latest Ref Rng & Units 06/18/2018 06/17/2018 06/15/2018  WBC 3.6 - 11.0 K/uL 15.0(H) 9.8 9.4  Hemoglobin 12.0 - 16.0 g/dL 11.4(L) 12.6 12.7  Hematocrit 35.0 - 47.0 % 33.4(L) 35.5 36.1  Platelets 150 - 440 K/uL 129(L) 148(L) 152   A POS  Assessment/Plan  Active Problems:   Labor and delivery, indication for care    Plan for discharge today.   Discharge Instructions: Per After Visit Summary. Activity: Advance as tolerated. Pelvic rest for 6 weeks.  Also refer to After Visit Summary Diet: Regular Medications: Allergies as of 06/19/2018   No Known Allergies     Medication List    TAKE these medications   prenatal vitamin w/FE, FA 29-1 MG Chew chewable tablet Chew 1 tablet by mouth daily at 12 noon.      Outpatient follow up:   Postpartum contraception: Nexplanon. Return to office 4 wks for placement  Discharged Condition: good  Discharged to: home  Newborn Data: Disposition:home with mother  Apgars: APGAR (1 MIN): 8   APGAR (5 MINS): 10   APGAR (10 MINS):    Baby Feeding: Breast    Doreene Burkennie Bridger Pizzi, CNM  06/19/2018 8:09 AM

## 2018-06-19 NOTE — Final Progress Note (Signed)
Discharge Day SOAP Note:  Progress Note - Vaginal Delivery  Stephanie Walker is a [age] [address][age] [address].o. G1P1001 now PP day 2 s/p Vaginal, Spontaneous . Delivery was uncomplicated  Subjective  The patient has the following complaints: has no unusual complaints  Pain is controlled with current medications.   Patient is urinating without difficulty.  She is ambulating well.    Objective  Vital signs: BP 108/75 (BP Location: Right Arm)   Pulse 81   Temp 98 F (36.7 C) (Oral)   Resp 20   Ht 5\' 2"  (1.575 m)   Wt 171 lb (77.6 kg)   LMP 09/12/2017 (Approximate)   SpO2 98%   Breastfeeding? Unknown   BMI 31.28 kg/m   Physical Exam: Gen: NAD Fundus Fundal Tone: Firm  Lochia Amount: Small  Perineum Appearance: Approximated, Edematous     Data Review Labs: CBC Latest Ref Rng & Units 06/18/2018 06/17/2018 06/15/2018  WBC 3.6 - 11.0 K/uL 15.0(H) 9.8 9.4  Hemoglobin 12.0 - 16.0 g/dL 11.4(L) 12.6 12.7  Hematocrit 35.0 - 47.0 % 33.4(L) 35.5 36.1  Platelets 150 - 440 K/uL 129(L) 148(L) 152   A POS  Assessment/Plan  Active Problems:   Labor and delivery, indication for care    Plan for discharge today.   Discharge Instructions: Per After Visit Summary. Activity: Advance as tolerated. Pelvic rest for 6 weeks.  Also refer to After Visit Summary Diet: Regular Medications: Allergies as of 06/19/2018   No Known Allergies     Medication List    TAKE these medications   prenatal vitamin w/FE, FA 29-1 MG Chew chewable tablet Chew 1 tablet by mouth daily at 12 noon.      Outpatient follow up:  Postpartum contraception: Nexplanon. Return to office 4 wks for placement  Discharged Condition: good  Discharged to: home  Newborn Data: Disposition:home with mother  Apgars: APGAR (1 MIN): 8   APGAR (5 MINS): 10   APGAR (10 MINS):    Baby Feeding: Breast    Doreene Burkennie Tykia Mellone, CNM  06/19/2018 8:09 AM

## 2018-06-19 NOTE — Progress Notes (Signed)
Discharge instructions given. Patient verbalizes understanding of teaching. Patient discharged home via wheelchair at 1120. 

## 2018-06-22 ENCOUNTER — Encounter: Payer: BLUE CROSS/BLUE SHIELD | Admitting: Certified Nurse Midwife

## 2018-07-31 ENCOUNTER — Ambulatory Visit (INDEPENDENT_AMBULATORY_CARE_PROVIDER_SITE_OTHER): Payer: BLUE CROSS/BLUE SHIELD | Admitting: Certified Nurse Midwife

## 2018-07-31 DIAGNOSIS — Z3046 Encounter for surveillance of implantable subdermal contraceptive: Secondary | ICD-10-CM | POA: Diagnosis not present

## 2018-07-31 DIAGNOSIS — Z30017 Encounter for initial prescription of implantable subdermal contraceptive: Secondary | ICD-10-CM

## 2018-07-31 NOTE — Progress Notes (Signed)
Karna ChristmasBrandy Beed is a 22 y.o. year old Caucasian female here for Nexplanon insertion.  No LMP recorded., last sexual intercourse was prior to delivery of baby.   Risks/benefits/side effects of Nexplanon have been discussed and her questions have been answered.  Specifically, a failure rate of 12/998 has been reported, with an increased failure rate if pt takes St. John's Wort and/or antiseizure medicaitons.  Karna ChristmasBrandy Cropp is aware of the common side effect of irregular bleeding, which the incidence of decreases over time.  BP 109/67   Pulse 97   Ht 5\' 2"  (1.575 m)   Wt 138 lb 3 oz (62.7 kg)   Breastfeeding? Yes   BMI 25.27 kg/m   No results found for this or any previous visit (from the past 24 hour(s)).   She is right-handed, so her left arm, approximately 4 inches proximal from the elbow, was cleansed with alcohol and anesthetized with 2cc of 2% Lidocaine.  The area was cleansed again with betadine and the Nexplanon was inserted per manufacturer's recommendations without difficulty.  A steri-strip and pressure bandage were applied.  Pt was instructed to keep the area clean and dry, remove pressure bandage in 24 hours, and keep insertion site covered with the steri-strip for 3-5 days.  Back up contraception was recommended for 2 weeks.  She was given a card indicating date Nexplanon was inserted and date it needs to be removed. Follow-up PRN problems.  Pattricia BossAnnie Zailah Zagami,CNM

## 2018-07-31 NOTE — Progress Notes (Signed)
Subjective:    Stephanie Walker is a 22 y.o. 431P1001 Caucasian female who presents for a postpartum visit. She is 6 weeks postpartum following a spontaneous vaginal delivery at 39.5 gestational weeks. Anesthesia: epidural. I have fully reviewed the prenatal and intrapartum course. Postpartum course has been WNL. Baby's course has been WNL. Baby is feeding by breast. Bleeding staining only. Bowel function is normal. Bladder function is normal. Patient is not sexually active. Last sexual activity: prior to delivery. Contraception method is Nexplanon. Postpartum depression screening: negative. Score 0.  Last pap 11/2817 and was negative.  The following portions of the patient's history were reviewed and updated as appropriate: allergies, current medications, past medical history, past surgical history and problem list.  Review of Systems Pertinent items are noted in HPI.   Vitals:   07/31/18 0830  BP: 109/67  Pulse: 97  Weight: 138 lb 3 oz (62.7 kg)  Height: 5\' 2"  (1.575 m)   No LMP recorded.  Objective:   General:  alert, cooperative and no distress   Breasts:  deferred, no complaints  Lungs: clear to auscultation bilaterally  Heart:  regular rate and rhythm  Abdomen: soft, nontender   Vulva: normal  Vagina: normal vagina, healing still occurring at site of repair. Sample of premarin cream with instructions on use.   Cervix:  closed  Corpus: Well-involuted  Adnexa:  Non-palpable  Rectal Exam: no hemorrhoids        Assessment:   Postpartum exam 6 wks s/p SVD Breastfeeding Depression screening Contraception counseling   Plan:  : Nexplanon placed today Follow up in: 1 yr for annual exam or earlier if needed  Doreene BurkeAnnie Janice Bodine, CNM

## 2018-07-31 NOTE — Progress Notes (Signed)
Pt is here for a post partum visit. Is breast feeding. Is interested in Nexplanon, has not had a period, has not resumed intercourse. Screening 0.

## 2018-07-31 NOTE — Patient Instructions (Signed)
Nexplanon Instructions After Insertion  Keep bandage clean and dry for 24 hours  May use ice/Tylenol/Ibuprofen for soreness or pain  If you develop fever, drainage or increased warmth from incision site-contact office immediately   

## 2019-08-02 ENCOUNTER — Other Ambulatory Visit: Payer: Self-pay

## 2019-08-02 ENCOUNTER — Encounter: Payer: Self-pay | Admitting: Certified Nurse Midwife

## 2019-08-02 ENCOUNTER — Ambulatory Visit (INDEPENDENT_AMBULATORY_CARE_PROVIDER_SITE_OTHER): Payer: BC Managed Care – PPO | Admitting: Certified Nurse Midwife

## 2019-08-02 VITALS — BP 120/87 | HR 91 | Ht 62.0 in | Wt 122.0 lb

## 2019-08-02 DIAGNOSIS — Z13220 Encounter for screening for lipoid disorders: Secondary | ICD-10-CM

## 2019-08-02 DIAGNOSIS — E049 Nontoxic goiter, unspecified: Secondary | ICD-10-CM | POA: Diagnosis not present

## 2019-08-02 DIAGNOSIS — N939 Abnormal uterine and vaginal bleeding, unspecified: Secondary | ICD-10-CM | POA: Diagnosis not present

## 2019-08-02 DIAGNOSIS — Z01419 Encounter for gynecological examination (general) (routine) without abnormal findings: Secondary | ICD-10-CM | POA: Diagnosis not present

## 2019-08-02 DIAGNOSIS — Z136 Encounter for screening for cardiovascular disorders: Secondary | ICD-10-CM

## 2019-08-02 NOTE — Progress Notes (Signed)
GYNECOLOGY ANNUAL PREVENTATIVE CARE ENCOUNTER NOTE  History:     Stephanie Walker is a 23 y.o. 731P1001 female here for a routine annual gynecologic exam.  Current complaints: bleeding x 1 month   Denies abnormal , discharge, pelvic pain, problems with intercourse or other gynecologic concerns.    Gynecologic History Patient's last menstrual period was 07/06/2019 (exact date). Contraception: Nexplanon Last Pap: 12/08/17 Results were: normal Last mammogram: n/a    Obstetric History OB History  Gravida Para Term Preterm AB Living  1 1 1  0 0 1  SAB TAB Ectopic Multiple Live Births  0 0 0 0 1    # Outcome Date GA Lbr Len/2nd Weight Sex Delivery Anes PTL Lv  1 Term 06/17/18 456w5d 06:09 / 00:17 7 lb 8.3 oz (3.41 kg) F Vag-Spont EPI  LIV    Past Medical History:  Diagnosis Date  . Acne   . Oligomenorrhea     Past Surgical History:  Procedure Laterality Date  . NO PAST SURGERIES      Current Outpatient Medications on File Prior to Visit  Medication Sig Dispense Refill  . prenatal vitamin w/FE, FA (NATACHEW) 29-1 MG CHEW chewable tablet Chew 1 tablet by mouth daily at 12 noon.     No current facility-administered medications on file prior to visit.     No Known Allergies  Social History:  reports that she has never smoked. She has never used smokeless tobacco. She reports previous alcohol use. She reports that she does not use drugs.  Family History  Problem Relation Age of Onset  . Hypertension Mother   . Breast cancer Mother   . Ovarian cancer Neg Hx   . Colon cancer Neg Hx   . Diabetes Neg Hx     The following portions of the patient's history were reviewed and updated as appropriate: allergies, current medications, past family history, past medical history, past social history, past surgical history and problem list.  Review of Systems Pertinent items noted in HPI and remainder of comprehensive ROS otherwise negative.  Physical Exam:  BP 120/87   Pulse 91   Ht  5\' 2"  (1.575 m)   Wt 122 lb (55.3 kg)   LMP 07/06/2019 (Exact Date)   BMI 22.31 kg/m  CONSTITUTIONAL: Well-developed, well-nourished female in no acute distress.  HENT:  Normocephalic, atraumatic, External right and left ear normal. Oropharynx is clear and moist EYES: Conjunctivae and EOM are normal. Pupils are equal, round, and reactive to light. No scleral icterus.  NECK: Normal range of motion, supple, no masses. Thyroid enlarged on right side.  SKIN: Skin is warm and dry. No rash noted. Not diaphoretic. No erythema. No pallor. MUSCULOSKELETAL: Normal range of motion. No tenderness.  No cyanosis, clubbing, or edema.  2+ distal pulses. NEUROLOGIC: Alert and oriented to person, place, and time. Normal reflexes, muscle tone coordination. No cranial nerve deficit noted. PSYCHIATRIC: Normal mood and affect. Normal behavior. Normal judgment and thought content. CARDIOVASCULAR: Normal heart rate noted, regular rhythm RESPIRATORY: Clear to auscultation bilaterally. Effort and breath sounds normal, no problems with respiration noted. BREASTS: Symmetric in size. No masses, skin changes, nipple drainage, or lymphadenopathy. ABDOMEN: Soft, normal bowel sounds, no distention noted.  No tenderness, rebound or guarding.  PELVIC: Normal appearing external genitalia; normal appearing vaginal mucosa and cervix.  No abnormal discharge noted.  Pap smear not due  Normal uterine size, no other palpable masses, no uterine or adnexal tenderness.   Assessment and Plan:  Annual Well Women GYN  Exam Pap smear not indicated until 11/2020 Mammogram N/A Labs: CBC, TSH, Lipid panel  Routine preventative health maintenance measures emphasized. Please refer to After Visit Summary for other counseling recommendations.      Philip Aspen, CNM

## 2019-08-02 NOTE — Patient Instructions (Signed)
Preventive Care 21-24 Years Old, Female Preventive care refers to visits with your health care provider and lifestyle choices that can promote health and wellness. This includes:  A yearly physical exam. This may also be called an annual well check.  Regular dental visits and eye exams.  Immunizations.  Screening for certain conditions.  Healthy lifestyle choices, such as eating a healthy diet, getting regular exercise, not using drugs or products that contain nicotine and tobacco, and limiting alcohol use. What can I expect for my preventive care visit? Physical exam Your health care provider will check your:  Height and weight. This may be used to calculate body mass index (BMI), which tells if you are at a healthy weight.  Heart rate and blood pressure.  Skin for abnormal spots. Counseling Your health care provider may ask you questions about your:  Alcohol, tobacco, and drug use.  Emotional well-being.  Home and relationship well-being.  Sexual activity.  Eating habits.  Work and work environment.  Method of birth control.  Menstrual cycle.  Pregnancy history. What immunizations do I need?  Influenza (flu) vaccine  This is recommended every year. Tetanus, diphtheria, and pertussis (Tdap) vaccine  You may need a Td booster every 10 years. Varicella (chickenpox) vaccine  You may need this if you have not been vaccinated. Human papillomavirus (HPV) vaccine  If recommended by your health care provider, you may need three doses over 6 months. Measles, mumps, and rubella (MMR) vaccine  You may need at least one dose of MMR. You may also need a second dose. Meningococcal conjugate (MenACWY) vaccine  One dose is recommended if you are age 19-21 years and a first-year college student living in a residence hall, or if you have one of several medical conditions. You may also need additional booster doses. Pneumococcal conjugate (PCV13) vaccine  You may need  this if you have certain conditions and were not previously vaccinated. Pneumococcal polysaccharide (PPSV23) vaccine  You may need one or two doses if you smoke cigarettes or if you have certain conditions. Hepatitis A vaccine  You may need this if you have certain conditions or if you travel or work in places where you may be exposed to hepatitis A. Hepatitis B vaccine  You may need this if you have certain conditions or if you travel or work in places where you may be exposed to hepatitis B. Haemophilus influenzae type b (Hib) vaccine  You may need this if you have certain conditions. You may receive vaccines as individual doses or as more than one vaccine together in one shot (combination vaccines). Talk with your health care provider about the risks and benefits of combination vaccines. What tests do I need?  Blood tests  Lipid and cholesterol levels. These may be checked every 5 years starting at age 20.  Hepatitis C test.  Hepatitis B test. Screening  Diabetes screening. This is done by checking your blood sugar (glucose) after you have not eaten for a while (fasting).  Sexually transmitted disease (STD) testing.  BRCA-related cancer screening. This may be done if you have a family history of breast, ovarian, tubal, or peritoneal cancers.  Pelvic exam and Pap test. This may be done every 3 years starting at age 21. Starting at age 30, this may be done every 5 years if you have a Pap test in combination with an HPV test. Talk with your health care provider about your test results, treatment options, and if necessary, the need for more tests.   Follow these instructions at home: Eating and drinking   Eat a diet that includes fresh fruits and vegetables, whole grains, lean protein, and low-fat dairy.  Take vitamin and mineral supplements as recommended by your health care provider.  Do not drink alcohol if: ? Your health care provider tells you not to drink. ? You are  pregnant, may be pregnant, or are planning to become pregnant.  If you drink alcohol: ? Limit how much you have to 0-1 drink a day. ? Be aware of how much alcohol is in your drink. In the [place] one drink equals one 12 oz bottle of beer (355 mL), one 5 oz glass of wine (148 mL), or one 1 oz glass of hard liquor (44 mL). Lifestyle  Take daily care of your teeth and gums.  Stay active. Exercise for at least 30 minutes on 5 or more days each week.  Do not use any products that contain nicotine or tobacco, such as cigarettes, e-cigarettes, and chewing tobacco. If you need help quitting, ask your health care provider.  If you are sexually active, practice safe sex. Use a condom or other form of birth control (contraception) in order to prevent pregnancy and STIs (sexually transmitted infections). If you plan to become pregnant, see your health care provider for a preconception visit. What's next?  Visit your health care provider once a year for a well check visit.  Ask your health care provider how often you should have your eyes and teeth checked.  Stay up to date on all vaccines. This information is not intended to replace advice given to you by your health care provider. Make sure you discuss any questions you have with your health care provider. Document Released: 01/24/2002 Document Revised: 08/09/2018 Document Reviewed: 08/09/2018 Elsevier Patient Education  2020 [employer]  

## 2019-08-03 LAB — THYROID PANEL WITH TSH
Free Thyroxine Index: 1.9 (ref 1.2–4.9)
T3 Uptake Ratio: 28 % (ref 24–39)
T4, Total: 6.8 ug/dL (ref 4.5–12.0)
TSH: 3.5 u[IU]/mL (ref 0.450–4.500)

## 2019-08-03 LAB — CBC
Hematocrit: 44 % (ref 34.0–46.6)
Hemoglobin: 14.7 g/dL (ref 11.1–15.9)
MCH: 29.8 pg (ref 26.6–33.0)
MCHC: 33.4 g/dL (ref 31.5–35.7)
MCV: 89 fL (ref 79–97)
Platelets: 166 10*3/uL (ref 150–450)
RBC: 4.94 x10E6/uL (ref 3.77–5.28)
RDW: 12.9 % (ref 11.7–15.4)
WBC: 4.8 10*3/uL (ref 3.4–10.8)

## 2019-08-03 LAB — LIPID PANEL
Chol/HDL Ratio: 2.1 ratio (ref 0.0–4.4)
Cholesterol, Total: 139 mg/dL (ref 100–199)
HDL: 66 mg/dL (ref >39)
LDL Calculated: 66 mg/dL (ref 0–99)
Triglycerides: 37 mg/dL (ref 0–149)
VLDL Cholesterol Cal: 7 mg/dL (ref 5–40)

## 2019-08-22 IMAGING — US US OB COMP LESS 14 WK
1 series · 14 of 28 positions shown · non-contrast
Comparison: Outside Ob ultrasound 10/30/2017

CLINICAL DATA: 21-year-old female with vaginal bleeding in the
first trimester of pregnancy. Quantitative beta HCG pending.
Estimated gestational age by ultrasound in [DATE] weeks 3
days.

EXAM:
OBSTETRIC <14 WK ULTRASOUND
TECHNIQUE: Transabdominal ultrasound was performed for evaluation of the
gestation as well as the maternal uterus and adnexal regions.

[Series 1: us ob comp less 14 wk · 0.20mm/px · 14 of 53 slices shown]
[im 2/53]
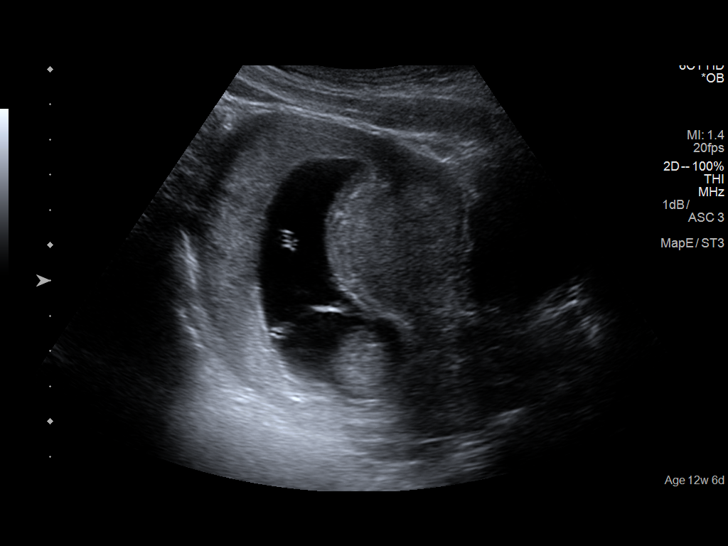
[im 6/53]
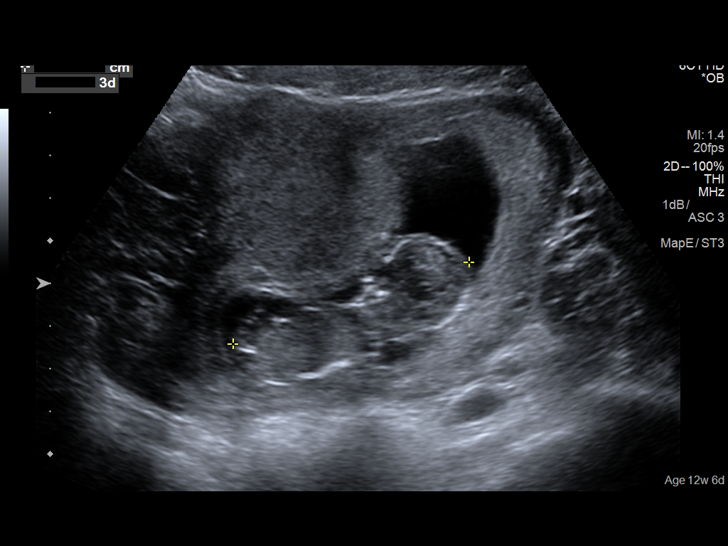
[im 10/53]
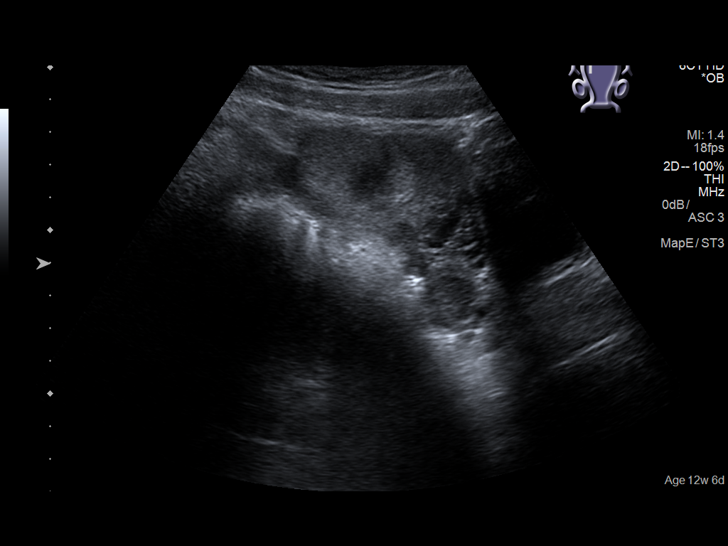
[im 14/53]
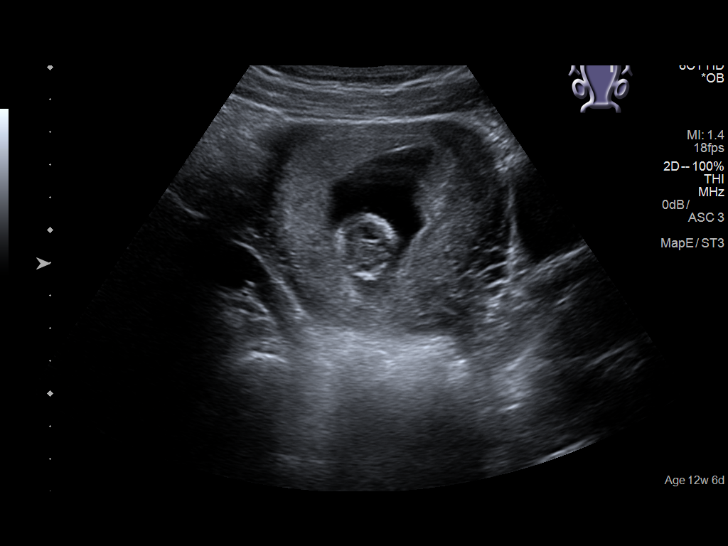
[im 18/53]
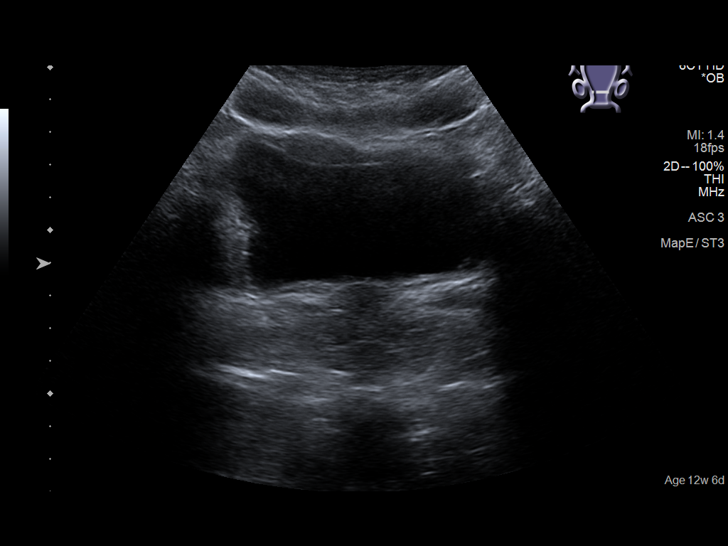
[im 22/53]
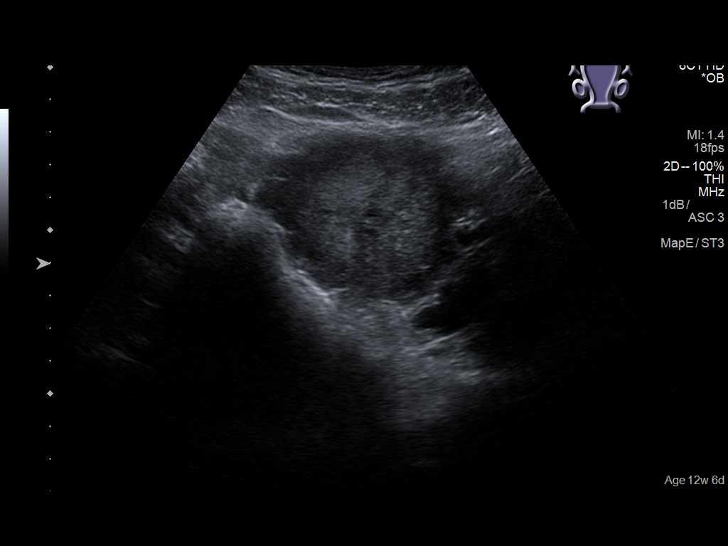
[im 26/53]
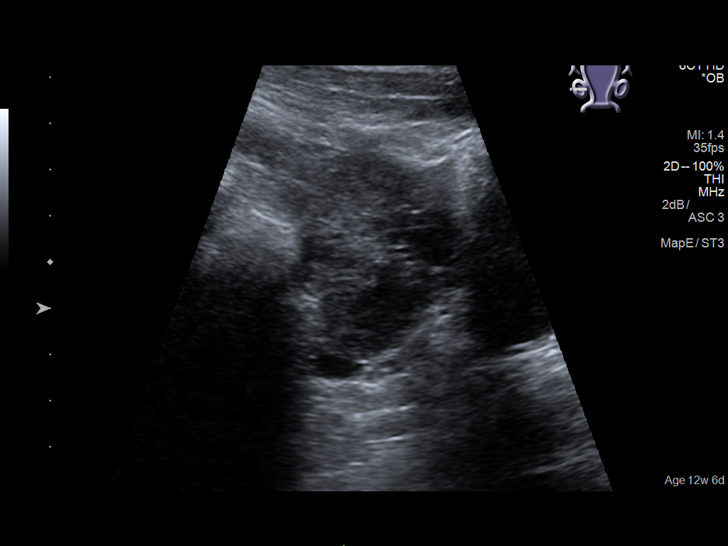
[im 29/53]
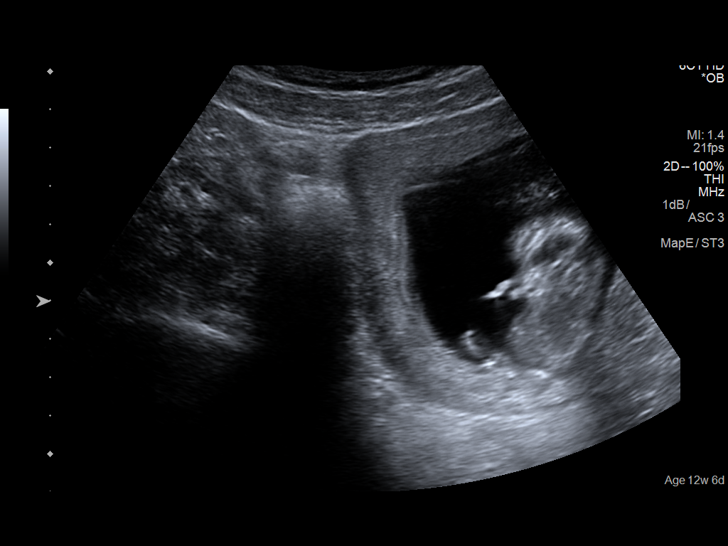
[im 33/53]
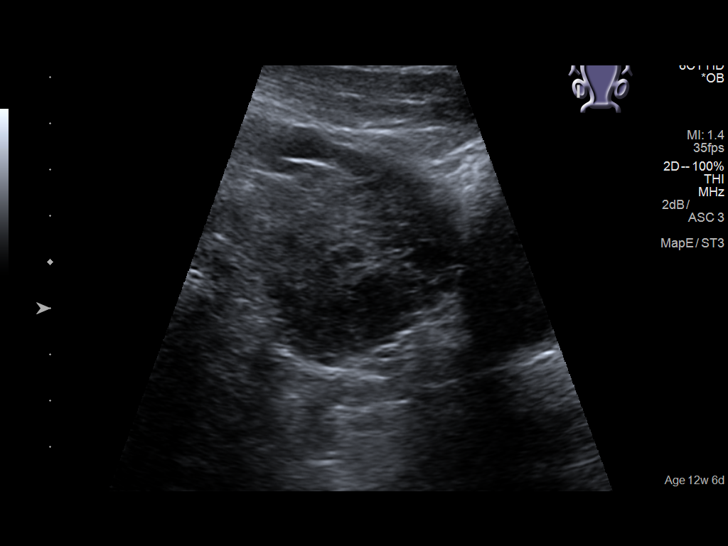
[im 37/53]
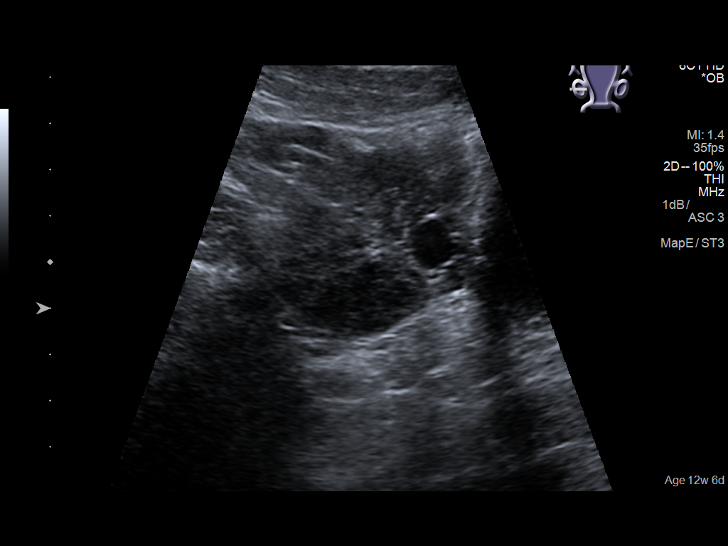
[im 41/53]
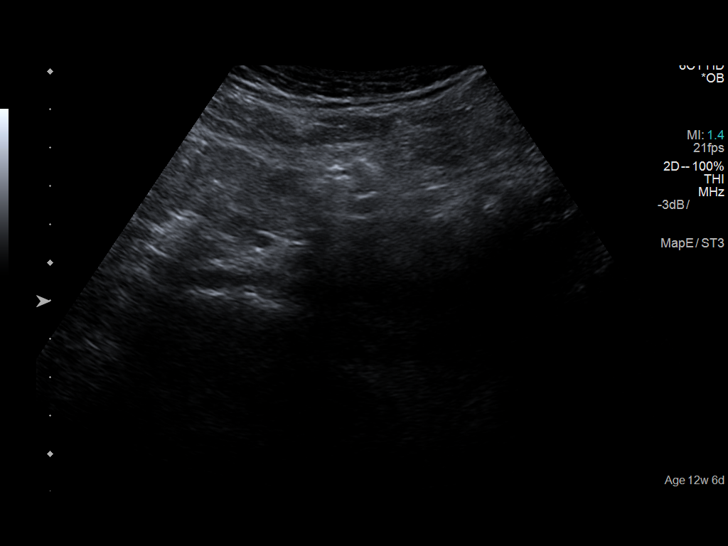
[im 45/53]
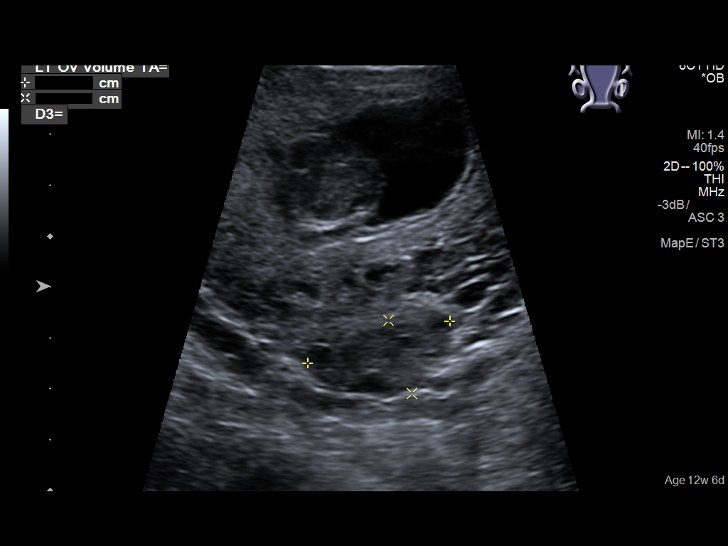
[im 49/53]
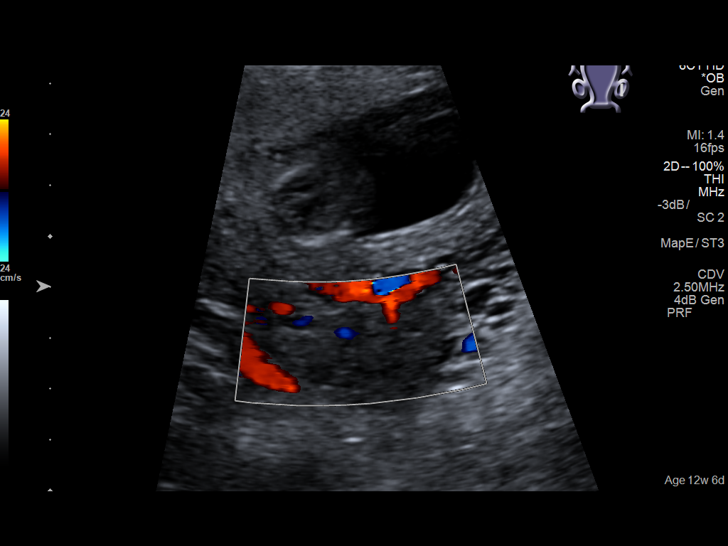
[im 53/53]
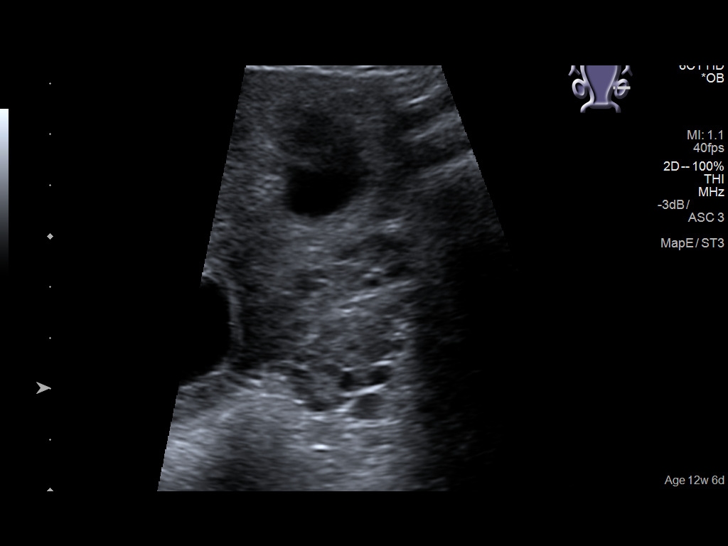

[14 of 28 positions shown; findings below may reference images not displayed]

FINDINGS: Intrauterine gestational sac: Single

Yolk sac:  Not visible

Embryo:  Visible

Cardiac Activity: Detected

Heart Rate: 153 bpm

CRL:   59  mm   12 w 3 d                  US EDC: 06/22/2018

Subchorionic hemorrhage:  None visualized.

Maternal uterus/adnexae: No pelvic free fluid. The right ovary
appears normal measuring 3.3 x 2.2 x 2.7 cm. The left ovary appears
normal measuring 2.9 x 1.5 x 1.9 cm.
IMPRESSION: Single living IUP demonstrated. No acute maternal findings
visualized.

## 2019-10-07 ENCOUNTER — Other Ambulatory Visit: Payer: Self-pay

## 2019-10-07 ENCOUNTER — Encounter: Payer: Self-pay | Admitting: Certified Nurse Midwife

## 2019-10-07 ENCOUNTER — Ambulatory Visit (INDEPENDENT_AMBULATORY_CARE_PROVIDER_SITE_OTHER): Payer: BC Managed Care – PPO | Admitting: Certified Nurse Midwife

## 2019-10-07 VITALS — BP 116/79 | HR 76 | Ht 62.0 in | Wt 130.4 lb

## 2019-10-07 DIAGNOSIS — Z3049 Encounter for surveillance of other contraceptives: Secondary | ICD-10-CM

## 2019-10-07 DIAGNOSIS — Z3046 Encounter for surveillance of implantable subdermal contraceptive: Secondary | ICD-10-CM

## 2019-10-07 DIAGNOSIS — Z23 Encounter for immunization: Secondary | ICD-10-CM | POA: Diagnosis not present

## 2019-10-07 NOTE — Patient Instructions (Signed)

## 2019-10-07 NOTE — Progress Notes (Signed)
Patient is here for a Nexplanon removal. Does not desire another form of birth control. C/o constant bleeding.

## 2019-10-07 NOTE — Progress Notes (Signed)
Stephanie Walker is a 23 y.o. year old G44P1001 Caucasian female here for Nexplanon removal.  She was given informed consent for removal  BP 116/79   Pulse 76   Ht 5\' 2"  (1.575 m)   Wt 130 lb 6 oz (59.1 kg)   BMI 23.85 kg/m  No LMP recorded. Patient has had an implant. No results found for this or any previous visit (from the past 24 hour(s)).   Appropriate time out taken. Nexplanon site identified.  Area prepped in usual sterile fashon. Two cc's of 2% lidocaine was used to anesthetize the area. A small stab incision was made right beside the implant on the distal portion.  There was less than 3 cc blood loss. There were no complications.  The patient tolerated the procedure well.  She was instructed to keep the area clean and dry, remove pressure bandage in 24 hours, and keep insertion site covered with the steri-strips for 3-5 days. She will use condoms for birth control. Follow-up PRN problems.  Philip Aspen, CNM

## 2020-02-08 ENCOUNTER — Ambulatory Visit: Payer: Medicaid Other | Attending: Internal Medicine

## 2020-02-08 DIAGNOSIS — Z23 Encounter for immunization: Secondary | ICD-10-CM | POA: Insufficient documentation

## 2020-02-08 NOTE — Progress Notes (Signed)
   Covid-19 Vaccination Clinic  Name:  Lulani Bour    MRN: [id number] DOB: 05-Jul-1996  02/08/2020  Ms. Eisenmenger was observed post Covid-19 immunization for 15 minutes without incidence. She was provided with Vaccine Information Sheet and instruction to access the V-Safe system.   Ms. Lopata was instructed to call 911 with any severe reactions post vaccine: Marland Kitchen Difficulty breathing  . Swelling of your face and throat  . A fast heartbeat  . A bad rash all over your body  . Dizziness and weakness    Immunizations Administered    Name Date Dose VIS Date Route   Moderna COVID-19 Vaccine 02/08/2020  9:42 AM 0.5 mL 11/12/2019 Intramuscular   Manufacturer: Moderna   Lot: [id number]   NDC: 64680-321-22

## 2020-03-07 ENCOUNTER — Ambulatory Visit: Payer: Medicaid Other | Attending: Internal Medicine

## 2020-03-07 DIAGNOSIS — Z23 Encounter for immunization: Secondary | ICD-10-CM

## 2020-03-07 NOTE — Progress Notes (Signed)
   Covid-19 Vaccination Clinic  Name:  Stephanie Walker    MRN: [id number] DOB: 02/19/1996  03/07/2020  [name] was observed post Covid-19 immunization for 15 minutes without incident. She was provided with Vaccine Information Sheet and instruction to access the V-Safe system.   Ms. Sample was instructed to call 911 with any severe reactions post vaccine: Marland Kitchen Difficulty breathing  . Swelling of face and throat  . A fast heartbeat  . A bad rash all over body  . Dizziness and weakness   Immunizations Administered    Name Date Dose VIS Date Route   Moderna COVID-19 Vaccine 03/07/2020  2:37 PM 0.5 mL 11/12/2019 Intramuscular   Manufacturer: Gala Murdoch   Lot: [id number]   NDC: 31517-616-07

## 2020-03-10 ENCOUNTER — Ambulatory Visit: Payer: Medicaid Other

## 2020-08-03 ENCOUNTER — Encounter: Payer: BC Managed Care – PPO | Admitting: Certified Nurse Midwife

## 2020-08-11 ENCOUNTER — Encounter: Payer: Self-pay | Admitting: Certified Nurse Midwife

## 2020-08-30 ENCOUNTER — Emergency Department
Admission: EM | Admit: 2020-08-30 | Discharge: 2020-08-30 | Disposition: A | Discharge status: Home / Self Care | Payer: BC Managed Care – PPO | Attending: Emergency Medicine | Admitting: Emergency Medicine

## 2020-08-30 ENCOUNTER — Other Ambulatory Visit: Payer: Self-pay

## 2020-08-30 DIAGNOSIS — J029 Acute pharyngitis, unspecified: Secondary | ICD-10-CM | POA: Insufficient documentation

## 2020-08-30 DIAGNOSIS — R630 Anorexia: Secondary | ICD-10-CM | POA: Insufficient documentation

## 2020-08-30 DIAGNOSIS — R5381 Other malaise: Secondary | ICD-10-CM | POA: Insufficient documentation

## 2020-08-30 DIAGNOSIS — Z20822 Contact with and (suspected) exposure to covid-19: Secondary | ICD-10-CM | POA: Insufficient documentation

## 2020-08-30 DIAGNOSIS — R110 Nausea: Secondary | ICD-10-CM

## 2020-08-30 DIAGNOSIS — R5383 Other fatigue: Secondary | ICD-10-CM | POA: Insufficient documentation

## 2020-08-30 DIAGNOSIS — R112 Nausea with vomiting, unspecified: Secondary | ICD-10-CM | POA: Insufficient documentation

## 2020-08-30 LAB — CBC
HCT: 43.9 % (ref 36.0–46.0)
Hemoglobin: 14.7 g/dL (ref 12.0–15.0)
MCH: 29.5 pg (ref 26.0–34.0)
MCHC: 33.5 g/dL (ref 30.0–36.0)
MCV: 88 fL (ref 80.0–100.0)
Platelets: 123 10*3/uL — ABNORMAL LOW (ref 150–400)
RBC: 4.99 MIL/uL (ref 3.87–5.11)
RDW: 12.1 % (ref 11.5–15.5)
WBC: 4.5 10*3/uL (ref 4.0–10.5)
nRBC: 0 % (ref 0.0–0.2)

## 2020-08-30 LAB — URINALYSIS, COMPLETE (UACMP) WITH MICROSCOPIC
Bacteria, UA: NONE SEEN
RBC / HPF: >50 RBC/hpf — ABNORMAL HIGH (ref 0–5)
Specific Gravity, Urine: 1.029 (ref 1.005–1.030)
Squamous Epithelial / HPF: >50 — ABNORMAL HIGH (ref 0–5)
WBC, UA: >50 WBC/hpf — ABNORMAL HIGH (ref 0–5)

## 2020-08-30 LAB — COMPREHENSIVE METABOLIC PANEL
ALT: 12 U/L (ref 0–44)
AST: 15 U/L (ref 15–41)
Albumin: 4.4 g/dL (ref 3.5–5.0)
Alkaline Phosphatase: 65 U/L (ref 38–126)
Anion gap: 8 (ref 5–15)
BUN: 10 mg/dL (ref 6–20)
CO2: 29 mmol/L (ref 22–32)
Calcium: 9 mg/dL (ref 8.9–10.3)
Chloride: 103 mmol/L (ref 98–111)
Creatinine, Ser: 0.98 mg/dL (ref 0.44–1.00)
GFR calc Af Amer: >60 mL/min (ref >60)
GFR calc non Af Amer: >60 mL/min (ref >60)
Glucose, Bld: 99 mg/dL (ref 70–99)
Potassium: 4.2 mmol/L (ref 3.5–5.1)
Sodium: 140 mmol/L (ref 135–145)
Total Bilirubin: 0.6 mg/dL (ref 0.3–1.2)
Total Protein: 7.9 g/dL (ref 6.5–8.1)

## 2020-08-30 LAB — MONONUCLEOSIS SCREEN: Mono Screen: NEGATIVE

## 2020-08-30 LAB — SARS CORONAVIRUS 2 BY RT PCR (HOSPITAL ORDER, PERFORMED IN ~~LOC~~ HOSPITAL LAB): SARS Coronavirus 2: NEGATIVE

## 2020-08-30 LAB — POCT PREGNANCY, URINE
Preg Test, Ur: NEGATIVE
Preg Test, Ur: NEGATIVE
Preg Test, Ur: NEGATIVE

## 2020-08-30 MED ORDER — MAGIC MOUTHWASH W/LIDOCAINE: 5.0000 mL | Freq: Four times a day (QID) | ORAL | 0 refills | Status: DC

## 2020-08-30 MED ORDER — ONDANSETRON 4 MG PO TBDP: 4.0000 mg | ORAL_TABLET | Freq: Three times a day (TID) | ORAL | 2 refills | Status: DC | PRN

## 2020-08-30 NOTE — ED Triage Notes (Signed)
States hasn't been able to keep food down since this past Wednesday. Patient is well appearing, moist mucus membranes, able to speak in complete sentences without difficulty. Skin warm/dry, normal color for ethnicity.

## 2020-08-30 NOTE — ED Provider Notes (Signed)
[facility] Emergency Department Provider Note  ____________________________________________  Time seen: Approximately 1:10 PM  I have reviewed the triage vital signs and the nursing notes.   HISTORY  Chief Complaint Emesis    HPI Stephanie Walker is a 24 y.o. female who presents the emergency department complaining of profound fatigue, sore throat, malaise, decreased appetite.  Patient states that over the past 4 days she has lost her appetite.  She states she will force herself to eat something, then becomes very nauseated.  No profound emesis.  Patient has had no diarrhea or constipation.  She denies any fevers or chills, nasal congestion, cough.  She is vaccinated for Covid.  She denies any recent sick contacts.  No history of recurrent GI complaints.  Patient has no abdominal pain.  No pelvic pain.  No flank pain.  No dysuria, polyuria, hematuria.         Past Medical History:  Diagnosis Date  . Acne   . Oligomenorrhea     There are no problems to display for this patient.   Past Surgical History:  Procedure Laterality Date  . NO PAST SURGERIES      Prior to Admission medications   Medication Sig Start Date End Date Taking? Authorizing Provider  etonogestrel (NEXPLANON) 68 MG IMPL implant 1 each by Subdermal route once.    [provider]  magic mouthwash w/lidocaine SOLN Take 5 mLs by mouth 4 (four) times daily. Gargle and spit 08/30/20   Masyn Fullam, Delorise Royals, PA-C  ondansetron (ZOFRAN-ODT) 4 MG disintegrating tablet Take 1 tablet (4 mg total) by mouth every 8 (eight) hours as needed for nausea or vomiting. 08/30/20   Jule Schlabach, Delorise Royals, PA-C    Allergies Patient has no known allergies.  Family History  Problem Relation Age of Onset  . Hypertension Mother   . Breast cancer Mother   . Ovarian cancer Neg Hx   . Colon cancer Neg Hx   . Diabetes Neg Hx     Social History Social History   Tobacco Use  . Smoking status: Never  Smoker  . Smokeless tobacco: Never Used  Vaping Use  . Vaping Use: Never used  Substance Use Topics  . Alcohol use: Not Currently    Comment: weekly   . Drug use: No     Review of Systems  Constitutional: No fever/chills.  Positive for malaise and fatigue Eyes: No visual changes. No discharge ENT: Sore throat Cardiovascular: no chest pain. Respiratory: no cough. No SOB. Gastrointestinal: Decreased appetite no abdominal pain.  Positive for nausea with eating but no vomiting.  No diarrhea.  No constipation. Musculoskeletal: Negative for musculoskeletal pain. Skin: Negative for rash, abrasions, lacerations, ecchymosis. Neurological: Negative for headaches, focal weakness or numbness. 10-point ROS otherwise negative.  ____________________________________________   PHYSICAL EXAM:  VITAL SIGNS: ED Triage Vitals  Enc Vitals Group     BP 08/30/20 0833 (!) 129/91     Pulse Rate 08/30/20 0833 (!) 106     Resp 08/30/20 0833 20     Temp 08/30/20 0833 98.3 F (36.8 C)     Temp Source 08/30/20 0833 Oral     SpO2 08/30/20 0833 100 %     Weight 08/30/20 0834 128 lb (58.1 kg)     Height 08/30/20 0834 5\' 2"  (1.575 m)     Head Circumference --      Peak Flow --      Pain Score 08/30/20 0833 7     Pain Loc --  Pain Edu? --      Excl. in GC? --      Constitutional: Alert and oriented. Well appearing and in no acute distress. Eyes: Conjunctivae are normal. PERRL. EOMI. Head: Atraumatic. ENT:      Ears:       Nose: No congestion/rhinnorhea.      Mouth/Throat: Mucous membranes are moist.  Oropharynx is mildly erythematous but nonedematous.  Uvula is midline. Neck: No stridor.  Neck is supple with full range of motion Hematological/Lymphatic/Immunilogical: Scattered, nontender posterior cervical lymphadenopathy. Cardiovascular: Normal rate, regular rhythm. Normal S1 and S2.  Good peripheral circulation. Respiratory: Normal respiratory effort without tachypnea or retractions.  Lungs CTAB. Good air entry to the bases with no decreased or absent breath sounds. Gastrointestinal: Bowel sounds 4 quadrants. Soft and nontender to palpation. No guarding or rigidity. No palpable masses. No distention. No CVA tenderness. Musculoskeletal: Full range of motion to all extremities. No gross deformities appreciated. Neurologic:  Normal speech and language. No gross focal neurologic deficits are appreciated.  Skin:  Skin is warm, dry and intact. No rash noted. Psychiatric: Mood and affect are normal. Speech and behavior are normal. Patient exhibits appropriate insight and judgement.   ____________________________________________   LABS (all labs ordered are listed, but only abnormal results are displayed)  Labs Reviewed  CBC - Abnormal; Notable for the following components:      Result Value   Platelets 123 (*)    All other components within normal limits  URINALYSIS, COMPLETE (UACMP) WITH MICROSCOPIC - Abnormal; Notable for the following components:   Color, Urine RED (*)    APPearance TURBID (*)    Glucose, UA   (*)    Value: TEST NOT REPORTED DUE TO COLOR INTERFERENCE OF URINE PIGMENT   Hgb urine dipstick   (*)    Value: TEST NOT REPORTED DUE TO COLOR INTERFERENCE OF URINE PIGMENT   Bilirubin Urine   (*)    Value: TEST NOT REPORTED DUE TO COLOR INTERFERENCE OF URINE PIGMENT   Ketones, ur   (*)    Value: TEST NOT REPORTED DUE TO COLOR INTERFERENCE OF URINE PIGMENT   Protein, ur   (*)    Value: TEST NOT REPORTED DUE TO COLOR INTERFERENCE OF URINE PIGMENT   Nitrite   (*)    Value: TEST NOT REPORTED DUE TO COLOR INTERFERENCE OF URINE PIGMENT   Leukocytes,Ua   (*)    Value: TEST NOT REPORTED DUE TO COLOR INTERFERENCE OF URINE PIGMENT   RBC / HPF >50 (*)    WBC, UA >50 (*)    Squamous Epithelial / LPF >50 (*)    All other components within normal limits  SARS CORONAVIRUS 2 BY RT PCR (HOSPITAL ORDER, PERFORMED IN [facility])  COMPREHENSIVE METABOLIC  PANEL  MONONUCLEOSIS SCREEN  POCT PREGNANCY, URINE  POC URINE PREG, ED   ____________________________________________  EKG   ____________________________________________  RADIOLOGY   No results found.  ____________________________________________    PROCEDURES  Procedure(s) performed:    Procedures    Medications - No data to display   ____________________________________________   INITIAL IMPRESSION / ASSESSMENT AND PLAN / ED COURSE  Pertinent labs & imaging results that were available during my care of the patient were reviewed by me and considered in my medical decision making (see chart for details).  Review of the [place] CSRS was performed in accordance of the [facility] prior to dispensing any controlled drugs.  Patient's diagnosis is consistent with decreased appetite,, fatigue, nausea.  Patient presented to the emergency department with sudden onset of symptoms 4 days ago.  Patient denies any abdominal pain.  No cough, shortness of breath.  No fevers or chills.  Patient does have sore throat, decreased appetite, significant fatigue.  Differential included COVID-19, mononucleosis.  I do feel that patient symptoms are more consistent with mono than Covid.  Patient also had discoloration of her urine but has had no visible hematuria.  No dysuria, polyuria.  No pelvic pain.  No flank pain.  No vaginal bleeding or discharge.  At this time with no tenderness, no pain reported, no other symptoms I will not investigate urine discoloration further at this time.  I have discussed these results however with the patient if she has any change in symptoms she should be seen by primary care or return to the emergency department for evaluation.  I will prescribe the patient Zofran, Magic mouthwash for symptom relief.  Patient will be tested for Covid and mono and discharged prior to return results.  If these results are negative, patient begins to experience new or ongoing  symptoms she can return to the emergency department for further evaluation or follow-up primary care.   Patient is given ED precautions to return to the ED for any worsening or new symptoms.     ____________________________________________  FINAL CLINICAL IMPRESSION(S) / ED DIAGNOSES  Final diagnoses:  Decreased appetite  Fatigue, unspecified type  Nausea      NEW MEDICATIONS STARTED DURING THIS VISIT:  ED Discharge Orders         Ordered    ondansetron (ZOFRAN-ODT) 4 MG disintegrating tablet  Every 8 hours PRN        08/30/20 1334    magic mouthwash w/lidocaine SOLN  4 times daily       Note to Pharmacy: Dispense in a 1/1/1 ratio. Use lidocaine, diphenhydramine, prednisolone   08/30/20 1334              This chart was dictated using voice recognition software/Dragon. Despite best efforts to proofread, errors can occur which can change the meaning. Any change was purely unintentional.    Racheal Patches, PA-C 08/30/20 1338    Merwyn Katos, MD 08/30/20 [phone]

## 2020-08-30 NOTE — ED Notes (Signed)
Went to discharge pt   Pt was not in room  Was able to call pt with instructions

## 2021-01-04 ENCOUNTER — Encounter: Payer: BC Managed Care – PPO | Admitting: Certified Nurse Midwife

## 2021-01-06 ENCOUNTER — Encounter: Payer: Medicaid Other | Admitting: Certified Nurse Midwife

## 2021-01-20 ENCOUNTER — Encounter: Payer: Medicaid Other | Admitting: Certified Nurse Midwife

## 2021-09-19 ENCOUNTER — Encounter: Payer: Self-pay | Admitting: Emergency Medicine

## 2021-09-19 ENCOUNTER — Emergency Department
Admission: EM | Admit: 2021-09-19 | Discharge: 2021-09-19 | Disposition: A | Discharge status: Home / Self Care | Payer: BC Managed Care – PPO | Attending: Emergency Medicine | Admitting: Emergency Medicine

## 2021-09-19 ENCOUNTER — Other Ambulatory Visit: Payer: Self-pay

## 2021-09-19 DIAGNOSIS — S299XXA Unspecified injury of thorax, initial encounter: Secondary | ICD-10-CM | POA: Insufficient documentation

## 2021-09-19 DIAGNOSIS — W109XXA Fall (on) (from) unspecified stairs and steps, initial encounter: Secondary | ICD-10-CM | POA: Diagnosis not present

## 2021-09-19 DIAGNOSIS — Y9239 Other specified sports and athletic area as the place of occurrence of the external cause: Secondary | ICD-10-CM | POA: Insufficient documentation

## 2021-09-19 DIAGNOSIS — S301XXA Contusion of abdominal wall, initial encounter: Secondary | ICD-10-CM | POA: Diagnosis not present

## 2021-09-19 DIAGNOSIS — S3991XA Unspecified injury of abdomen, initial encounter: Secondary | ICD-10-CM | POA: Diagnosis present

## 2021-09-19 DIAGNOSIS — W1800XA Striking against unspecified object with subsequent fall, initial encounter: Secondary | ICD-10-CM

## 2021-09-19 NOTE — ED Notes (Signed)
DC ppw provided to pt. Pe denies any other needs at this time. Pt ambulated to WR at time of DC

## 2021-09-19 NOTE — ED Provider Notes (Signed)
[facility] Emergency Department Provider Note   ____________________________________________    I have reviewed the triage vital signs and the nursing notes.   HISTORY  Chief Complaint Fall     HPI Stephanie Walker is a 25 y.o. female who presents after a fall that occurred yesterday.  Patient reports approximately 6 PM she slipped [place], she reports she slid down on her bottom but did not suffer any significant injury.  She did not hit her head.  She went home and was feeling fine.  This morning she woke up feeling sore along her left lower chest and left abdomen.  She does note that she thinks she could be approximately [redacted] weeks pregnant but has not seen OB/GYN at this time.  No vaginal bleeding, no pelvic pain.  No neurodeficits, no back pain  Past Medical History:  Diagnosis Date   Acne    Oligomenorrhea     There are no problems to display for this patient.   Past Surgical History:  Procedure Laterality Date   NO PAST SURGERIES      Prior to Admission medications   Medication Sig Start Date End Date Taking? Authorizing Provider  etonogestrel (NEXPLANON) 68 MG IMPL implant 1 each by Subdermal route once.    [provider]  magic mouthwash w/lidocaine SOLN Take 5 mLs by mouth 4 (four) times daily. Gargle and spit 08/30/20   Cuthriell, Delorise Royals, PA-C  ondansetron (ZOFRAN-ODT) 4 MG disintegrating tablet Take 1 tablet (4 mg total) by mouth every 8 (eight) hours as needed for nausea or vomiting. 08/30/20   Cuthriell, Delorise Royals, PA-C     Allergies Patient has no known allergies.  Family History  Problem Relation Age of Onset   Hypertension Mother    Breast cancer Mother    Ovarian cancer Neg Hx    Colon cancer Neg Hx    Diabetes Neg Hx     Social History Social History   Tobacco Use   Smoking status: Never   Smokeless tobacco: Never  Vaping Use   Vaping Use: Never used  Substance Use  Topics   Alcohol use: Not Currently    Comment: weekly    Drug use: No    Review of Systems  Constitutional: No dizziness  ENT: No facial injury   Gastrointestinal: No nausea, no vomiting.   Genitourinary: Negative for vaginal bleeding Musculoskeletal: As above Skin: Negative for laceration Neurological: Negative for headaches, no deficits    ____________________________________________   PHYSICAL EXAM:  VITAL SIGNS: ED Triage Vitals  Enc Vitals Group     BP 09/19/21 1514 (!) 128/95     Pulse Rate 09/19/21 1514 (!) 107     Resp 09/19/21 1514 18     Temp 09/19/21 1514 98.5 F (36.9 C)     Temp Source 09/19/21 1514 Oral     SpO2 09/19/21 1514 100 %     Weight 09/19/21 1515 61.7 kg (136 lb)     Height 09/19/21 1515 1.575 m (5\' 2" )     Head Circumference --      Peak Flow --      Pain Score 09/19/21 1515 7     Pain Loc --      Pain Edu? --      Excl. in GC? --      Constitutional: Alert and oriented. No acute distress. Pleasant and interactive Eyes: Conjunctivae are normal.  Head: Atraumatic. Nose: No  congestion/rhinnorhea. Mouth/Throat: Mucous membranes are moist.   Cardiovascular: Normal rate, regular rhythm.  Respiratory: Normal respiratory effort.  No retractions. Abdomen: Soft, nontender, no tenderness over the spleen, able to sit up easily on her own without pain Genitourinary: deferred Musculoskeletal: Mild tenderness along the left lateral lower rib cage, no bony abnormalities or hematoma noted, no vertebral tenderness palpation, normal range of motion of all extremities. Neurologic:  Normal speech and language. No gross focal neurologic deficits are appreciated.   Skin:  Skin is warm, dry and intact. No rash noted.   ____________________________________________   LABS (all labs ordered are listed, but only abnormal results are displayed)  Labs Reviewed - No data to  display ____________________________________________  EKG   ____________________________________________  RADIOLOGY   ____________________________________________   PROCEDURES  Procedure(s) performed: No  Procedures   Critical Care performed: No ____________________________________________   INITIAL IMPRESSION / ASSESSMENT AND PLAN / ED COURSE  Pertinent labs & imaging results that were available during my care of the patient were reviewed by me and considered in my medical decision making (see chart for details).   Patient well-appearing and in no acute distress exam is quite reassuring consistent mainly with likely contusion given her mechanism of injury this is consistent.  Abdominal exam is quite reassuring.  No vaginal bleeding, no pelvic pain.  Recommend continued supportive care outpatient follow-up   ____________________________________________   FINAL CLINICAL IMPRESSION(S) / ED DIAGNOSES  Final diagnoses:  Fall from bumping against object as cause of accidental injury  Contusion of abdominal wall, initial encounter      NEW MEDICATIONS STARTED DURING THIS VISIT:  New Prescriptions   No medications on file     Note:  This document was prepared using Dragon voice recognition software and may include unintentional dictation errors.    Jene Every, MD 09/19/21 [phone]

## 2021-09-19 NOTE — ED Triage Notes (Signed)
Pt via POV from home. Pt c/o L sided rib and side pain, pt had a mechanical fall down around 10-15 stairs. Pt denies any vaginal bleeding or abd cramping. Pt states she think LMP 8/26 thinks she may be approx 5 weeks. Pregnancy has not been confirmed by a doctor.   Pt is A&Ox4 and NAD.  Denies LOC. Denies head injury.

## 2021-09-22 ENCOUNTER — Telehealth: Payer: Self-pay | Admitting: Certified Nurse Midwife

## 2021-09-22 ENCOUNTER — Other Ambulatory Visit (HOSPITAL_COMMUNITY)
Admission: RE | Admit: 2021-09-22 | Discharge: 2021-09-22 | Disposition: A | Discharge status: Home / Self Care | Payer: BC Managed Care – PPO | Source: Ambulatory Visit | Attending: Certified Nurse Midwife | Admitting: Certified Nurse Midwife

## 2021-09-22 ENCOUNTER — Other Ambulatory Visit: Payer: Self-pay

## 2021-09-22 ENCOUNTER — Ambulatory Visit (INDEPENDENT_AMBULATORY_CARE_PROVIDER_SITE_OTHER): Payer: BC Managed Care – PPO | Admitting: Certified Nurse Midwife

## 2021-09-22 DIAGNOSIS — Z202 Contact with and (suspected) exposure to infections with a predominantly sexual mode of transmission: Secondary | ICD-10-CM

## 2021-09-22 NOTE — Telephone Encounter (Signed)
Per AT; pt can keep original appt for 24th but can come in sooner on nurse schedule for self swab (swab for everything). Pt to schedule this appt.

## 2021-09-22 NOTE — Progress Notes (Signed)
Pt to self swab due to possible STD exposure in early pregnancy. Pt reports having no symptoms and would like to be swabbed for everything.

## 2021-09-22 NOTE — Telephone Encounter (Signed)
Stephanie Walker was scheduled to see Stephanie Walker on Oct 18th for a pregnancy confirmation.  Stephanie Walker sent a mychart message asking to reschedule for a later time and needed 4pm.  I scheduled her for the next available which was October 24th at [facility] then responded and stated she needed something sooner because the father of her baby in utero just tested positive for chlamydia.  Stephanie Walker wants to know if she can wait until the 24th or should she get tested sooner.  Please advise.

## 2021-09-24 LAB — CERVICOVAGINAL ANCILLARY ONLY
Bacterial Vaginitis (gardnerella): POSITIVE — AB
Candida Glabrata: NEGATIVE
Candida Vaginitis: NEGATIVE
Chlamydia: POSITIVE — AB
Comment: NEGATIVE
Comment: NEGATIVE
Comment: NEGATIVE
Comment: NEGATIVE
Comment: NEGATIVE
Comment: NORMAL
Neisseria Gonorrhea: NEGATIVE
Trichomonas: NEGATIVE

## 2021-09-25 ENCOUNTER — Other Ambulatory Visit: Payer: Self-pay | Admitting: Certified Nurse Midwife

## 2021-09-25 MED ORDER — AZITHROMYCIN 500 MG PO TABS: 1000.0000 mg | ORAL_TABLET | Freq: Once | ORAL | 0 refills | Status: AC

## 2021-09-25 MED ORDER — METRONIDAZOLE 0.75 % VA GEL: 1.0000 | Freq: Every day | VAGINAL | 0 refills | Status: AC

## 2021-09-25 MED ORDER — DOXYCYCLINE MONOHYDRATE 100 MG PO TABS
100.0000 mg | ORAL_TABLET | Freq: Two times a day (BID) | ORAL | 0 refills | Status: DC
Start: 2021-09-25 — End: 2021-09-25

## 2021-09-28 ENCOUNTER — Encounter: Payer: Medicaid Other | Admitting: Certified Nurse Midwife

## 2021-10-04 ENCOUNTER — Ambulatory Visit (INDEPENDENT_AMBULATORY_CARE_PROVIDER_SITE_OTHER): Payer: BC Managed Care – PPO | Admitting: Certified Nurse Midwife

## 2021-10-04 ENCOUNTER — Other Ambulatory Visit: Payer: Self-pay

## 2021-10-04 ENCOUNTER — Encounter: Payer: Self-pay | Admitting: Certified Nurse Midwife

## 2021-10-04 VITALS — BP 120/79 | HR 80 | Ht 62.0 in | Wt 137.5 lb

## 2021-10-04 DIAGNOSIS — Z3200 Encounter for pregnancy test, result unknown: Secondary | ICD-10-CM | POA: Diagnosis not present

## 2021-10-04 DIAGNOSIS — Z8489 Family history of other specified conditions: Secondary | ICD-10-CM

## 2021-10-04 LAB — POCT URINE PREGNANCY: Preg Test, Ur: POSITIVE — AB

## 2021-10-04 MED ORDER — DOXYLAMINE-PYRIDOXINE 10-10 MG PO TBEC: 1.0000 | DELAYED_RELEASE_TABLET | Freq: Four times a day (QID) | ORAL | 5 refills | Status: DC

## 2021-10-04 NOTE — Progress Notes (Signed)
Subjective:    Stephanie Walker is a 25 y.o. female who presents for evaluation of amenorrhea. She believes she could be pregnant. Pregnancy is desired. Sexual Activity: single partner, contraception: none. Current symptoms also include: nausea. Last period was normal.   Patient's last menstrual period was 08/10/2021 (approximate). The following portions of the patient's history were reviewed and updated as appropriate: allergies, current medications, past family history, past medical history, past social history, past surgical history, and problem list.  Review of Systems Pertinent items are noted in HPI.     Objective:    BP 120/79   Pulse 80   Ht 5\' 2"  (1.575 m)   Wt 137 lb 8 oz (62.4 kg)   LMP 08/10/2021 (Approximate)   BMI 25.15 kg/m  General: alert, cooperative, appears stated age, and no acute distress    Lab Review Urine HCG: positive    Assessment:    Absence of menstruation.     Plan:    Pregnancy Test:  Positive: EDC: 05/17/2022. Briefly discussed pre-natal care options. Midwifery care and MD care reviewed. Plans to see midwives. Encouraged well-balanced diet, plenty of rest when needed, pre-natal vitamins daily and walking for exercise. Discussed self-help for nausea, avoiding OTC medications until consulting provider or pharmacist, other than Tylenol as needed, minimal caffeine (1-2 cups daily) and avoiding alcohol. She will schedule u/s 1 wk, nurse visit in 2 wks her initial OB visit 3 wks.. Feel free to call with any questions. Orders placed for diclegis due to nausea and vomiting.   07/17/2022, CNM

## 2021-10-21 ENCOUNTER — Ambulatory Visit (INDEPENDENT_AMBULATORY_CARE_PROVIDER_SITE_OTHER): Payer: BC Managed Care – PPO

## 2021-10-21 ENCOUNTER — Ambulatory Visit (INDEPENDENT_AMBULATORY_CARE_PROVIDER_SITE_OTHER): Payer: BC Managed Care – PPO | Admitting: Certified Nurse Midwife

## 2021-10-21 ENCOUNTER — Other Ambulatory Visit: Payer: Self-pay

## 2021-10-21 VITALS — BP 127/80 | HR 88 | Ht 62.0 in | Wt 139.9 lb

## 2021-10-21 DIAGNOSIS — Z3481 Encounter for supervision of other normal pregnancy, first trimester: Secondary | ICD-10-CM | POA: Diagnosis not present

## 2021-10-21 DIAGNOSIS — Z3A10 10 weeks gestation of pregnancy: Secondary | ICD-10-CM

## 2021-10-21 DIAGNOSIS — Z113 Encounter for screening for infections with a predominantly sexual mode of transmission: Secondary | ICD-10-CM | POA: Diagnosis not present

## 2021-10-21 DIAGNOSIS — Z3401 Encounter for supervision of normal first pregnancy, first trimester: Secondary | ICD-10-CM | POA: Diagnosis not present

## 2021-10-21 DIAGNOSIS — Z8489 Family history of other specified conditions: Secondary | ICD-10-CM

## 2021-10-21 DIAGNOSIS — Z3200 Encounter for pregnancy test, result unknown: Secondary | ICD-10-CM

## 2021-10-21 NOTE — Patient Instructions (Incomplete)
Linden Dolin Class  May 24, 2017  Wednesday 7:00p - 9:00p  [facility] [place], [place]  June 28, 2017  Wednesday 7:00p - 9:00p [facility] [place], [place]    August 02, 2017   Wednesday 7:00p - 9:000p [facility] [place], [place]  August 30, 2017  Wednesday  7:00p - 9:00p [facility] [place], [place]  September 27, 2017 Wednesday 7:00p - 9:00p [facility] [place], [place]  Interested in a waterbirth?  This informational class will help you discover whether waterbirth is the right fit for you.  Education about waterbirth itself, supplies you would need and how to assemble your support team is what you can expect from this class.  Some obstetrical practices require this class in order to pursue a waterbirth.  (Not all obstetrical practices offer waterbirth check with your healthcare provider)  Register only the expectant mom, but you are encouraged to bring your partner to class!  Fees & Payment No fee  Register Online www.[employer]erveSpaces.se Search Waterbirth First Trimester of Pregnancy The first trimester of pregnancy starts on the first day of your last menstrual period until the end of week 12. This is also called months 1 through 3 of pregnancy. Body changes during your first trimester Your body goes through many changes during pregnancy. The changes usually return to normal after your baby is born. Physical changes You may gain or lose weight. Your breasts may grow larger and hurt. The area around your nipples may get darker. Dark spots or blotches may develop on your face. You may have changes in your hair. Health changes You may feel like you might vomit (nauseous), and you may vomit. You may have heartburn. You may have headaches. You may have trouble pooping (constipation). Your gums may bleed. Other changes You may get tired easily. You may pee (urinate)  more often. Your menstrual periods will stop. You may not feel hungry. You may want to eat certain kinds of food. You may have changes in your emotions from day to day. You may have more dreams. Follow these instructions at home: Medicines Take over-the-counter and prescription medicines only as told by your doctor. Some medicines are not safe during pregnancy. Take a prenatal vitamin that contains at least 600 micrograms (mcg) of folic acid. Eating and drinking Eat healthy meals that include: Fresh fruits and vegetables. Whole grains. Good sources of protein, such as meat, eggs, or tofu. Low-fat dairy products. Avoid raw meat and unpasteurized juice, milk, and cheese. If you feel like you may vomit, or you vomit: Eat 4 or 5 small meals a day instead of 3 large meals. Try eating a few soda crackers. Drink liquids between meals instead of during meals. You may need to take these actions to prevent or treat trouble pooping: Drink enough fluids to keep your pee (urine) pale yellow. Eat foods that are high in fiber. These include beans, whole grains, and fresh fruits and vegetables. Limit foods that are high in fat and sugar. These include fried or sweet foods. Activity Exercise only as told by your doctor. Most people can do their usual exercise routine during pregnancy. Stop exercising if you have cramps or pain in your lower belly (abdomen) or low back. Do not exercise if it is too hot or too humid, or if you are in a place of great height (high altitude). Avoid heavy lifting. If you choose to, you may have sex unless your doctor tells you not to.  Relieving pain and discomfort Wear a good support bra if your breasts are sore. Rest with your legs raised (elevated) if you have leg cramps or low back pain. If you have bulging veins (varicose veins) in your legs: Wear support hose as told by your doctor. Raise your feet for 15 minutes, 3-4 times a day. Limit salt in your  food. Safety Wear your seat belt at all times when you are in a car. Talk with your doctor if someone is hurting you or yelling at you. Talk with your doctor if you are feeling sad or have thoughts of hurting yourself. Lifestyle Do not use hot tubs, steam rooms, or saunas. Do not douche. Do not use tampons or scented sanitary pads. Do not use herbal medicines, illegal drugs, or medicines that are not approved by your doctor. Do not drink alcohol. Do not smoke or use any products that contain nicotine or tobacco. If you need help quitting, ask your doctor. Avoid cat litter boxes and soil that is used by cats. These carry germs that can cause harm to the baby and can cause a loss of your baby by miscarriage or stillbirth. General instructions Keep all follow-up visits. This is important. Ask for help if you need counseling or if you need help with nutrition. Your doctor can give you advice or tell you where to go for help. Visit your dentist. At home, brush your teeth with a soft toothbrush. Floss gently. Write down your questions. Take them to your prenatal visits. Where to find more information American Pregnancy Association: americanpregnancy.org [employer]: www.acog.org Office on Women's Health: MightyReward.co.nz Contact a doctor if: You are dizzy. You have a fever. You have mild cramps or pressure in your lower belly. You have a nagging pain in your belly area. You continue to feel like you may vomit, you vomit, or you have watery poop (diarrhea) for 24 hours or longer. You have a bad-smelling fluid coming from your vagina. You have pain when you pee. You are exposed to a disease that spreads from person to person, such as chickenpox, measles, Zika virus, HIV, or hepatitis. Get help right away if: You have spotting or bleeding from your vagina. You have very bad belly cramping or pain. You have shortness of breath or chest  pain. You have any kind of injury, such as from a fall or a car crash. You have new or increased pain, swelling, or redness in an arm or leg. Summary The first trimester of pregnancy starts on the first day of your last menstrual period until the end of week 12 (months 1 through 3). Eat 4 or 5 small meals a day instead of 3 large meals. Do not smoke or use any products that contain nicotine or tobacco. If you need help quitting, ask your doctor. Keep all follow-up visits. This information is not intended to replace advice given to you by your health care provider. Make sure you discuss any questions you have with your health care provider. Document Revised: 05/06/2020 Document Reviewed: 03/12/2020 Elsevier Patient Education  2022 Elsevier Inc. Commonly Asked Questions During Pregnancy  Cats: A parasite can be excreted in cat feces.  To avoid exposure you need to have another person empty the little box.  If you must empty the litter box you will need to wear gloves.  Wash your hands after handling your cat.  This parasite can also be found in raw or undercooked meat so this should also be  avoided.  Colds, Sore Throats, Flu: Please check your medication sheet to see what you can take for symptoms.  If your symptoms are unrelieved by these medications please call the office.  Dental Work: Most any dental work Agricultural consultant recommends is permitted.  X-rays should only be taken during the first trimester if absolutely necessary.  Your abdomen should be shielded with a lead apron during all x-rays.  Please notify your provider prior to receiving any x-rays.  Novocaine is fine; gas is not recommended.  If your dentist requires a note from [place] prior to dental work please call the office and we will provide one for you.  Exercise: Exercise is an important part of staying healthy during your pregnancy.  You may continue most exercises you were accustomed to prior to pregnancy.  Later in your pregnancy you  will most likely notice you have difficulty with activities requiring balance like riding a bicycle.  It is important that you listen to your body and avoid activities that put you at a higher risk of falling.  Adequate rest and staying well hydrated are a must!  If you have questions about the safety of specific activities ask your provider.    Exposure to Children with illness: Try to avoid obvious exposure; report any symptoms to [place] when noted,  If you have chicken pos, red measles or mumps, you should be immune to these diseases.   Please do not take any vaccines while pregnant unless you have checked with your OB provider.  Fetal Movement: After 28 weeks we recommend you do "kick counts" twice daily.  Lie or sit down in a calm quiet environment and count your baby movements "kicks".  You should feel your baby at least 10 times per hour.  If you have not felt 10 kicks within the first hour get up, walk around and have something sweet to eat or drink then repeat for an additional hour.  If count remains less than 10 per hour notify your provider.  Fumigating: Follow your pest control agent's advice as to how long to stay out of your home.  Ventilate the area well before re-entering.  Hemorrhoids:   Most over-the-counter preparations can be used during pregnancy.  Check your medication to see what is safe to use.  It is important to use a stool softener or fiber in your diet and to drink lots of liquids.  If hemorrhoids seem to be getting worse please call the office.   Hot Tubs:  Hot tubs Jacuzzis and saunas are not recommended while pregnant.  These increase your internal body temperature and should be avoided.  Intercourse:  Sexual intercourse is safe during pregnancy as long as you are comfortable, unless otherwise advised by your provider.  Spotting may occur after intercourse; report any bright red bleeding that is heavier than spotting.  Labor:  If you know that you are in labor, please go to  the hospital.  If you are unsure, please call the office and let us help you decide what to do.  Lifting, straining, etc:  If your job requires heavy lifting or straining please check with your provider for any limitations.  Generally, you should not lift items heavier than that you can lift simply with your hands and arms (no back muscles)  Painting:  Paint fumes do not harm your pregnancy, but may make you ill and should be avoided if possible.  Latex or water based paints have less odor than oils.  Use  adequate ventilation while painting.  Permanents & Hair Color:  Chemicals in hair dyes are not recommended as they cause increase hair dryness which can increase hair loss during pregnancy.  " Highlighting" and permanents are allowed.  Dye may be absorbed differently and permanents may not hold as well during pregnancy.  Sunbathing:  Use a sunscreen, as skin burns easily during pregnancy.  Drink plenty of fluids; avoid over heating.  Tanning Beds:  Because their possible side effects are still unknown, tanning beds are not recommended.  Ultrasound Scans:  Routine ultrasounds are performed at approximately 20 weeks.  You will be able to see your baby's general anatomy an if you would like to know the gender this can usually be determined as well.  If it is questionable when you conceived you may also receive an ultrasound early in your pregnancy for dating purposes.  Otherwise ultrasound exams are not routinely performed unless there is a medical necessity.  Although you can request a scan we ask that you pay for it when conducted because insurance does not cover " patient request" scans.  Work: If your pregnancy proceeds without complications you may work until your due date, unless your physician or employer advises otherwise.  Round Ligament Pain/Pelvic Discomfort:  Sharp, shooting pains not associated with bleeding are fairly common, usually occurring in the second trimester of pregnancy.  They  tend to be worse when standing up or when you remain standing for long periods of time.  These are the result of pressure of certain pelvic ligaments called "round ligaments".  Rest, Tylenol and heat seem to be the most effective relief.  As the womb and fetus grow, they rise out of the pelvis and the discomfort improves.  Please notify the office if your pain seems different than that described.  It may represent a more serious condition.  Morning Sickness Morning sickness is when you feel like you may vomit (feel nauseous) during pregnancy. Sometimes, you may vomit. Morning sickness most often happens in the morning, but it can also happen at any time of the day. Some women may have morning sickness that makes them vomit all the time. This is a more serious problem that needs treatment. What are the causes? The cause of this condition is not known. What increases the risk? You had vomiting or a feeling like you may vomit before your pregnancy. You had morning sickness in another pregnancy. You are pregnant with more than one baby, such as twins. What are the signs or symptoms? Feeling like you may vomit. Vomiting. How is this treated? Treatment is usually not needed for this condition. You may only need to change what you eat. In some cases, your doctor may give you some things to take for your condition. These include: Vitamin B6 supplements. Medicines to treat the feeling that you may vomit. Ginger. Follow these instructions at home: Medicines Take over-the-counter and prescription medicines only as told by your doctor. Do not take any medicines until you talk with your doctor about them first. Take multivitamins before you get pregnant. These can stop or lessen the symptoms of morning sickness. Eating and drinking Eat dry toast or crackers before getting out of bed. Eat 5 or 6 small meals a day. Eat dry and bland foods like rice and baked potatoes. Do not eat greasy, fatty, or spicy  foods. Have someone cook for you if the smell of food causes you to vomit or to feel like you may vomit. If you  feel like you may vomit after taking prenatal vitamins, take them at night or with a snack. Eat protein foods when you need a snack. Nuts, yogurt, and cheese are good choices. Drink fluids throughout the day. Try ginger ale made with real ginger, ginger tea made from fresh grated ginger, or ginger candies. General instructions Do not smoke or use any products that contain nicotine or tobacco. If you need help quitting, ask your doctor. Use an air purifier to keep the air in your house free of smells. Get lots of fresh air. Try to avoid smells that make you feel sick. Try wearing an acupressure wristband. This is a wristband that is used to treat seasickness. Try a treatment called acupuncture. In this treatment, a doctor puts needles into certain areas of your body to make you feel better. Contact a doctor if: You need medicine to feel better. You feel dizzy or light-headed. You are losing weight. Get help right away if: The feeling that you may vomit will not go away, or you cannot stop vomiting. You faint. You have very bad pain in your belly. Summary Morning sickness is when you feel like you may vomit (feel nauseous) during pregnancy. You may feel sick in the morning, but you can feel this way at any time of the day. Making some changes to what you eat may help your symptoms go away. This information is not intended to replace advice given to you by your health care provider. Make sure you discuss any questions you have with your health care provider. Document Revised: 07/13/2020 Document Reviewed: 06/22/2020 Elsevier Patient Education  2022 [employer].

## 2021-10-21 NOTE — Progress Notes (Signed)
Stephanie Walker presents for NOB nurse interview visit. Pregnancy confirmation done 10/04/2021. G2P1001. Pregnancy education material explained and given. No cats in the home. NOB labs ordered. HIV labs and Drug screen were explained optional and she did not decline. Drug screen ordered. PNV encouraged. Genetic screening options discussed. Genetic testing: Ordered.  Pt may discuss with provider.  Financial policy not applicable. FMLA form reviewed and signed. Pt. To follow up with [name] in 2 weeks for NOB physical.  All questions answered.

## 2021-10-22 LAB — URINALYSIS, ROUTINE W REFLEX MICROSCOPIC
Bilirubin, UA: NEGATIVE
Glucose, UA: NEGATIVE
Ketones, UA: NEGATIVE
Leukocytes,UA: NEGATIVE
Nitrite, UA: NEGATIVE
RBC, UA: NEGATIVE
Specific Gravity, UA: 1.029 (ref 1.005–1.030)
Urobilinogen, Ur: 0.2 mg/dL (ref 0.2–1.0)
pH, UA: 6 (ref 5.0–7.5)

## 2021-10-22 LAB — ABO AND RH: Rh Factor: POSITIVE

## 2021-10-22 LAB — HCV INTERPRETATION

## 2021-10-22 LAB — VIRAL HEPATITIS HBV, HCV
HCV Ab: <0.1 s/co ratio (ref 0.0–0.9)
Hep B Core Total Ab: NEGATIVE
Hep B Surface Ab, Qual: NONREACTIVE
Hepatitis B Surface Ag: NEGATIVE

## 2021-10-22 LAB — MICROSCOPIC EXAMINATION: Casts: NONE SEEN /lpf

## 2021-10-22 LAB — RUBELLA SCREEN: Rubella Antibodies, IGG: <0.9 index — ABNORMAL LOW (ref >0.99)

## 2021-10-22 LAB — PARVOVIRUS B19 ANTIBODY, IGG AND IGM
Parvovirus B19 IgG: 5.1 index — ABNORMAL HIGH (ref 0.0–0.8)
Parvovirus B19 IgM: 0.1 index (ref 0.0–0.8)

## 2021-10-22 LAB — HIV ANTIBODY (ROUTINE TESTING W REFLEX): HIV Screen 4th Generation wRfx: NONREACTIVE

## 2021-10-22 LAB — VARICELLA ZOSTER ANTIBODY, IGG: Varicella zoster IgG: 403 index (ref >165)

## 2021-10-22 LAB — ANTIBODY SCREEN: Antibody Screen: NEGATIVE

## 2021-10-22 LAB — RPR: RPR Ser Ql: NONREACTIVE

## 2021-10-23 LAB — CULTURE, OB URINE

## 2021-10-23 LAB — PAIN MGT SCRN (14 DRUGS), UR
Amphetamine Scrn, Ur: NEGATIVE ng/mL
BARBITURATE SCREEN URINE: NEGATIVE ng/mL
BENZODIAZEPINE SCREEN, URINE: NEGATIVE ng/mL
Buprenorphine, Urine: NEGATIVE ng/mL
CANNABINOIDS UR QL SCN: NEGATIVE ng/mL
Cocaine (Metab) Scrn, Ur: NEGATIVE ng/mL
Creatinine(Crt), U: 155.2 mg/dL (ref 20.0–300.0)
Fentanyl, Urine: NEGATIVE pg/mL
Meperidine Screen, Urine: NEGATIVE ng/mL
Methadone Screen, Urine: NEGATIVE ng/mL
OXYCODONE+OXYMORPHONE UR QL SCN: NEGATIVE ng/mL
Opiate Scrn, Ur: NEGATIVE ng/mL
Ph of Urine: 5.9 (ref 4.5–8.9)
Phencyclidine Qn, Ur: NEGATIVE ng/mL
Propoxyphene Scrn, Ur: NEGATIVE ng/mL
Tramadol Screen, Urine: NEGATIVE ng/mL

## 2021-10-23 LAB — NICOTINE SCREEN, URINE: Cotinine Ql Scrn, Ur: NEGATIVE ng/mL

## 2021-10-23 LAB — URINE CULTURE, OB REFLEX

## 2021-10-24 LAB — GC/CHLAMYDIA PROBE AMP
Chlamydia trachomatis, NAA: NEGATIVE
Neisseria Gonorrhoeae by PCR: NEGATIVE

## 2021-10-25 NOTE — Addendum Note (Signed)
Addended by: Tommie Raymond on: 10/25/2021 10:35 AM   Modules accepted: Level of Service

## 2021-11-03 ENCOUNTER — Ambulatory Visit (INDEPENDENT_AMBULATORY_CARE_PROVIDER_SITE_OTHER): Payer: BC Managed Care – PPO | Admitting: Certified Nurse Midwife

## 2021-11-03 ENCOUNTER — Encounter: Payer: Self-pay | Admitting: Certified Nurse Midwife

## 2021-11-03 ENCOUNTER — Other Ambulatory Visit: Payer: Self-pay

## 2021-11-03 ENCOUNTER — Other Ambulatory Visit (HOSPITAL_COMMUNITY)
Admission: RE | Admit: 2021-11-03 | Discharge: 2021-11-03 | Disposition: A | Discharge status: Home / Self Care | Payer: BC Managed Care – PPO | Source: Ambulatory Visit | Attending: Certified Nurse Midwife | Admitting: Certified Nurse Midwife

## 2021-11-03 VITALS — BP 126/80 | HR 74 | Wt 139.3 lb

## 2021-11-03 DIAGNOSIS — Z3481 Encounter for supervision of other normal pregnancy, first trimester: Secondary | ICD-10-CM

## 2021-11-03 DIAGNOSIS — Z3A12 12 weeks gestation of pregnancy: Secondary | ICD-10-CM

## 2021-11-03 NOTE — Progress Notes (Signed)
NEW OB HISTORY AND PHYSICAL  SUBJECTIVE:       Stephanie Walker is a 25 y.o. G69P1001 female, Patient's last menstrual period was 08/10/2021 (approximate)., Estimated Date of Delivery: 05/17/22, [redacted]w[redacted]d, presents today for establishment of Prenatal Care. She has no unusual complaints.    Relationship: female partner Living with partner and daughter Work: Midwife Ex: none outside of work Denies alcohol , drugs , smoking  Gynecologic History Patient's last menstrual period was 08/10/2021 (approximate). Normal Contraception: none Last Pap: 11/2017. Results were: normal  Obstetric History OB History  Gravida Para Term Preterm AB Living  2 1 1  0 0 1  SAB IAB Ectopic Multiple Live Births  0 0 0 0 1    # Outcome Date GA Lbr Len/2nd Weight Sex Delivery Anes PTL Lv  2 Current           1 Term 06/17/18 [redacted]w[redacted]d 06:09 / 00:17 7 lb 8.3 oz (3.41 kg) F Vag-Spont EPI  LIV    Past Medical History:  Diagnosis Date  . Acne   . Oligomenorrhea     Past Surgical History:  Procedure Laterality Date  . NO PAST SURGERIES      Current Outpatient Medications on File Prior to Visit  Medication Sig Dispense Refill  . Doxylamine-Pyridoxine 10-10 MG TBEC Take 1 tablet by mouth 4 (four) times daily. Day 1 &2: 2 tablet at bedtimeDay 3 : if symptoms persists 1 tablet am; 2 tablet at bedtimeDay 4: 1 tablet am, 1 tab afternoon, 2 tab at bedtime 120 tablet 5  . Prenatal Vit-Fe Fumarate-FA (PRENATAL PO) Take by mouth.     No current facility-administered medications on file prior to visit.    No Known Allergies  Social History   Socioeconomic History  . Marital status: Single    Spouse name: Not on file  . Number of children: Not on file  . Years of education: Not on file  . Highest education level: Not on file  Occupational History  . Not on file  Tobacco Use  . Smoking status: Never  . Smokeless tobacco: Never  Vaping Use  . Vaping Use: Never used  Substance and Sexual Activity  .  Alcohol use: Not Currently  . Drug use: No  . Sexual activity: Yes    Birth control/protection: Implant, None  Other Topics Concern  . Not on file  Social History Narrative  . Not on file   Social Determinants of Health   Financial Resource Strain: Not on file  Food Insecurity: Not on file  Transportation Needs: Not on file  Physical Activity: Not on file  Stress: Not on file  Social Connections: Not on file  Intimate Partner Violence: Not on file    Family History  Problem Relation Age of Onset  . Hypertension Mother   . Breast cancer Mother   . Ovarian cancer Neg Hx   . Colon cancer Neg Hx   . Diabetes Neg Hx     The following portions of the patient's history were reviewed and updated as appropriate: allergies, current medications, past OB history, past medical history, past surgical history, past family history, past social history, and problem list.    OBJECTIVE: Initial Physical Exam (New OB)  GENERAL APPEARANCE: alert, well appearing, in no apparent distress, oriented to person, place and time HEAD: normocephalic, atraumatic MOUTH: mucous membranes moist, pharynx normal without lesions THYROID: no thyromegaly or masses present BREASTS: no masses noted, no significant tenderness, no palpable axillary nodes, no skin  changes LUNGS: clear to auscultation, no wheezes, rales or rhonchi, symmetric air entry HEART: regular rate and rhythm, no murmurs ABDOMEN: soft, nontender, nondistended, no abnormal masses, no epigastric pain and FHT present EXTREMITIES: no redness or tenderness in the calves or thighs, no edema, no limitation in range of motion, intact peripheral pulses SKIN: normal coloration and turgor, no rashes LYMPH NODES: no adenopathy palpable NEUROLOGIC: alert, oriented, normal speech, no focal findings or movement disorder noted  PELVIC EXAM EXTERNAL GENITALIA: normal appearing vulva with no masses, tenderness or lesions VAGINA: no abnormal discharge or  lesions CERVIX: no lesions or cervical motion tenderness UTERUS: gravid ADNEXA: no masses palpable and nontender OB EXAM PELVIMETRY: appears adequate RECTUM: exam not indicated  ASSESSMENT: Normal pregnancy  PLAN: New OB counseling: The patient has been given an overview regarding routine prenatal care. Recommendations regarding diet, weight gain, and exercise in pregnancy were given. Prenatal testing, optional genetic testing, carrier screening, and ultrasound use in pregnancy were reviewed. Maternit 21 today. Benefits of Breast Feeding were discussed. The patient is encouraged to consider nursing her baby post partum.    Doreene Burke, CNM

## 2021-11-04 LAB — CBC WITH DIFFERENTIAL/PLATELET
Basophils Absolute: 0 10*3/uL (ref 0.0–0.2)
Basos: 0 %
EOS (ABSOLUTE): 0.1 10*3/uL (ref 0.0–0.4)
Eos: 1 %
Hematocrit: 39.7 % (ref 34.0–46.6)
Hemoglobin: 13.7 g/dL (ref 11.1–15.9)
Immature Grans (Abs): 0 10*3/uL (ref 0.0–0.1)
Immature Granulocytes: 0 %
Lymphocytes Absolute: 1.9 10*3/uL (ref 0.7–3.1)
Lymphs: 19 %
MCH: 30.8 pg (ref 26.6–33.0)
MCHC: 34.5 g/dL (ref 31.5–35.7)
MCV: 89 fL (ref 79–97)
Monocytes Absolute: 0.7 10*3/uL (ref 0.1–0.9)
Monocytes: 7 %
Neutrophils Absolute: 7.2 10*3/uL — ABNORMAL HIGH (ref 1.4–7.0)
Neutrophils: 73 %
Platelets: 193 10*3/uL (ref 150–450)
RBC: 4.45 x10E6/uL (ref 3.77–5.28)
RDW: 12.8 % (ref 11.7–15.4)
WBC: 10 10*3/uL (ref 3.4–10.8)

## 2021-11-08 LAB — MATERNIT 21 PLUS CORE, BLOOD
Fetal Fraction: 7
Result (T21): NEGATIVE
Trisomy 13 (Patau syndrome): NEGATIVE
Trisomy 18 (Edwards syndrome): NEGATIVE
Trisomy 21 (Down syndrome): NEGATIVE

## 2021-11-10 LAB — CYTOLOGY - PAP: Diagnosis: NEGATIVE

## 2021-11-21 ENCOUNTER — Encounter: Payer: Self-pay | Admitting: Emergency Medicine

## 2021-11-21 ENCOUNTER — Other Ambulatory Visit: Payer: Self-pay

## 2021-11-21 ENCOUNTER — Emergency Department
Admission: EM | Admit: 2021-11-21 | Discharge: 2021-11-21 | Disposition: A | Discharge status: Home / Self Care | Payer: BC Managed Care – PPO | Attending: Emergency Medicine | Admitting: Emergency Medicine

## 2021-11-21 ENCOUNTER — Emergency Department: Payer: BC Managed Care – PPO

## 2021-11-21 DIAGNOSIS — J3489 Other specified disorders of nose and nasal sinuses: Secondary | ICD-10-CM | POA: Diagnosis not present

## 2021-11-21 DIAGNOSIS — Z3A15 15 weeks gestation of pregnancy: Secondary | ICD-10-CM | POA: Diagnosis not present

## 2021-11-21 DIAGNOSIS — O26892 Other specified pregnancy related conditions, second trimester: Secondary | ICD-10-CM | POA: Diagnosis not present

## 2021-11-21 DIAGNOSIS — O99512 Diseases of the respiratory system complicating pregnancy, second trimester: Secondary | ICD-10-CM | POA: Insufficient documentation

## 2021-11-21 DIAGNOSIS — Z20822 Contact with and (suspected) exposure to covid-19: Secondary | ICD-10-CM | POA: Insufficient documentation

## 2021-11-21 DIAGNOSIS — J101 Influenza due to other identified influenza virus with other respiratory manifestations: Secondary | ICD-10-CM

## 2021-11-21 LAB — RESP PANEL BY RT-PCR (FLU A&B, COVID) ARPGX2
Influenza A by PCR: POSITIVE — AB
Influenza B by PCR: NEGATIVE
SARS Coronavirus 2 by RT PCR: NEGATIVE

## 2021-11-21 NOTE — ED Notes (Signed)
Attempted to measure FHT, unsuccessful

## 2021-11-21 NOTE — ED Notes (Addendum)
No heart notes found with doppler. RN notified provider

## 2021-11-21 NOTE — ED Provider Notes (Signed)
[facility] Emergency Department Provider Note   ____________________________________________   Event Date/Time   First MD Initiated Contact with Patient 11/21/21 1514     (approximate)  I have reviewed the triage vital signs and the nursing notes.   HISTORY  Chief Complaint Fever, Cough, and Nasal Congestion    HPI Stephanie Walker is a 25 y.o. female patient presents to the emergency room for main complaint of cough.  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  Patient reports that Friday she started with low-grade fever of 100.5 that fever has increased over the past 2 days and today was 103.4.  Patient reports that she has cough (nonproductive), runny nose, sore throat, headache, body aches.  She describes her pain as a 6 out of 10 and aching in nature.  Patient denies nausea, vomiting, diarrhea.  Patient denies abdominal pain or cramping.  She is almost [redacted] weeks pregnant.  Past Medical History:  Diagnosis Date   Acne    Oligomenorrhea     Patient Active Problem List   Diagnosis Date Noted   Supervision of other normal pregnancy, antepartum 06/13/2018    Past Surgical History:  Procedure Laterality Date   NO PAST SURGERIES      Prior to Admission medications   Medication Sig Start Date End Date Taking? Authorizing Provider  Doxylamine-Pyridoxine 10-10 MG TBEC Take 1 tablet by mouth 4 (four) times daily. Day 1 &2: 2 tablet at bedtimeDay 3 : if symptoms persists 1 tablet am; 2 tablet at bedtimeDay 4: 1 tablet am, 1 tab afternoon, 2 tab at bedtime 10/04/21   Doreene Burke, CNM  Prenatal Vit-Fe Fumarate-FA (PRENATAL PO) Take by mouth.    [provider]    Allergies Patient has no known allergies.  Family History  Problem Relation Age of Onset   Hypertension Mother    Breast cancer Mother    Ovarian cancer Neg Hx    Colon cancer Neg Hx    Diabetes Neg  Hx     Social History Social History   Tobacco Use   Smoking status: Never   Smokeless tobacco: Never  Vaping Use   Vaping Use: Never used  Substance Use Topics   Alcohol use: Not Currently   Drug use: No    Review of Systems  Constitutional: Positive for fever/chills has temp of 100.3 in ED. Eyes: No visual changes. ENT: Positive runny nose, sore throat, headache Cardiovascular: Denies chest pain. Respiratory: Denies shortness of breath.  Positive for cough Gastrointestinal: No abdominal pain.  No nausea, no vomiting.  No diarrhea.  No constipation. Genitourinary: Negative for dysuria.  Negative for vaginal bleeding Musculoskeletal: Positive for generalized body aches Skin: Negative for rash. Neurological: Negative focal weakness or numbness.  Positive for headache  ____________________________________________   PHYSICAL EXAM:  VITAL SIGNS: ED Triage Vitals  Enc Vitals Group     BP 11/21/21 1337 118/83     Pulse Rate 11/21/21 1337 (!) 121     Resp 11/21/21 1337 20     Temp 11/21/21 1337 (!) 100.4 F (38 C)     Temp Source 11/21/21 1337 Oral     SpO2 11/21/21 1337 98 %     Weight 11/21/21 1338 140 lb (63.5 kg)     Height 11/21/21 1338 5\' 2"  (1.575 m)     Head Circumference --      Peak Flow --  Pain Score 11/21/21 1338 0     Pain Loc --      Pain Edu? --      Excl. in GC? --     Constitutional: Alert and oriented. Well appearing and in no acute distress. Eyes: Conjunctivae are normal. PERRL. EOMI. Head: Atraumatic. Nose: Positive rhinorrhea, nasal congestion Mouth/Throat: Mucous membranes are moist.  Oropharynx non-erythematous. Neck: No stridor.  No cervical adenopathy noted Cardiovascular: Normal rate, regular rhythm. Grossly normal heart sounds.  Good peripheral circulation.  Patient is slightly tachycardic. Respiratory: Normal respiratory effort.  No retractions. Lungs CTAB.  Patient has nonproductive cough. Gastrointestinal: Soft and nontender.  No distention. No abdominal bruits. No CVA tenderness. Genitourinary: Patient has no vaginal bleeding or pain. Musculoskeletal: No lower extremity tenderness nor edema.  No joint effusions.  Patient has generalized body aches. Neurologic:  Normal speech and language. No gross focal neurologic deficits are appreciated. No gait instability. Skin:  Skin is warm, dry and intact. No rash noted. Psychiatric: Mood and affect are normal. Speech and behavior are normal.  ____________________________________________   LABS (all labs ordered are listed, but only abnormal results are displayed)  Labs Reviewed  RESP PANEL BY RT-PCR (FLU A&B, COVID) ARPGX2 - Abnormal; Notable for the following components:      Result Value   Influenza A by PCR POSITIVE (*)    All other components within normal limits   ____________________________________________  EKG   ____________________________________________  RADIOLOGY  ED MD interpretation: I have reviewed the ultrasound and the radiologist's over read the ultrasound as well please see below for details  Official radiology report(s): US OB Limited  Result Date: 11/21/2021 CLINICAL DATA:  Unable to locate fetal heart tones EXAM: LIMITED OBSTETRIC ULTRASOUND COMPARISON:  Ultrasound FINDINGS: Number of Fetuses: 1 Heart Rate:  173 bpm Movement: Present Presentation: Cephalic Placental Location: Fundal and posterior Previa: None Amniotic Fluid (Subjective):  Within normal limits. AFI:  cm FL: 1.64 cm 14 w  6 d MATERNAL FINDINGS: Cervix:  Appears closed. Uterus/Adnexae: No abnormality visualized. IMPRESSION: 1. Fetus with movement and normal heart rate. 2. Normal amniotic fluid level. This exam is performed on an emergent basis and does not comprehensively evaluate fetal size, dating, or anatomy; follow-up complete OB US should be considered if further fetal assessment is warranted. Electronically Signed   By: Genevive Bi M.D.   On: 11/21/2021 16:57     ____________________________________________   PROCEDURES  Procedure(s) performed: None  Procedures  Critical Care performed: No  ____________________________________________   INITIAL IMPRESSION / ASSESSMENT AND PLAN / ED COURSE  Patient is a 25 year old female that is 2 days shy of [redacted] weeks pregnant.  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  Patient started 3 days ago with flulike symptoms.  Please see HPI for full detail.   Will swab patient for COVID/flu We were planning to discharge patient home to follow-up with her MyChart as far as status of respiratory panel.  However patient reports that they were unable to find her baby's heartbeat in triage and she is quite concerned about this.  I did not see a note that mentions this.  I will have nursing staff to use Doppler and attempt to find the baby's heart rate prior to her discharge. ED nurse and this provider were unable to find fetal heart tones. This was discussed with Dr. Larinda Buttery.  Agreed that patient should have OB ultrasound to locate fetal heart tones prior  to patient discharge.  Ultrasound results are back and fetus was seen with movement and normal heart rate.  Heart rate was 173.  Patient also has normal amniotic fluid level.  She is noted to be 14 weeks and 6 days pregnant. Patient respiratory panel has come back she is COVID-negative but is influenza A positive. She is encouraged to stay well-hydrated.  She should take Tylenol fever and generalized body aches.  She should treat other symptoms with over-the-counter medications that are safe during pregnancy. I will provide patient with a note to be out of work until she has been fever free and symptom-free for at least 24 hours.  Patient will be discharged at this time in stable condition.     ____________________________________________   FINAL CLINICAL IMPRESSION(S) / ED DIAGNOSES  Final diagnoses:   Viral upper respiratory tract infection  Flu-like symptoms     ED Discharge Orders     None        Note:  This document was prepared using Dragon voice recognition software and may include unintentional dictation errors.     Herschell Dimes, NP 11/21/21 1722    Chesley Noon, MD 11/21/21 Serena Croissant

## 2021-11-21 NOTE — ED Triage Notes (Signed)
Pt via POV from home. Pt c/o fever, cough, nasal congestion, sore throat, and nausea.  Pt states she took Tylenol around 11:30am. States that she cannot get her temp below 100.5. Pt is approx [redacted] weeks pregnant.

## 2021-11-21 NOTE — Discharge Instructions (Addendum)
You have presented to the emergency room today with flulike symptoms. Please drink plenty of fluids and be sure to stay hydrated. Take over-the-counter Tylenol for fever and generalized body pain. Treat your other symptoms with over-the-counter medications  We will COVID/flu/RSV test you today.  Please follow-up with your MyChart for these results.

## 2021-12-07 ENCOUNTER — Encounter: Payer: Self-pay | Admitting: Certified Nurse Midwife

## 2021-12-07 ENCOUNTER — Other Ambulatory Visit: Payer: Self-pay

## 2021-12-07 ENCOUNTER — Ambulatory Visit (INDEPENDENT_AMBULATORY_CARE_PROVIDER_SITE_OTHER): Payer: Medicaid Other | Admitting: Certified Nurse Midwife

## 2021-12-07 VITALS — BP 122/78 | HR 87 | Wt 139.0 lb

## 2021-12-07 DIAGNOSIS — Z3A17 17 weeks gestation of pregnancy: Secondary | ICD-10-CM

## 2021-12-07 DIAGNOSIS — Z3482 Encounter for supervision of other normal pregnancy, second trimester: Secondary | ICD-10-CM

## 2021-12-07 LAB — POCT URINALYSIS DIPSTICK OB
Bilirubin, UA: NEGATIVE
Blood, UA: NEGATIVE
Glucose, UA: NEGATIVE
Ketones, UA: NEGATIVE
Leukocytes, UA: NEGATIVE
Nitrite, UA: NEGATIVE
POC,PROTEIN,UA: NEGATIVE
Spec Grav, UA: 1.015 (ref 1.010–1.025)
Urobilinogen, UA: 0.2 E.U./dL
pH, UA: 7 (ref 5.0–8.0)

## 2021-12-07 NOTE — Patient Instructions (Signed)
Round Ligament Pain The round ligaments are a pair of cord-like tissues that help support the uterus. They can become a source of pain during pregnancy as the ligaments soften and stretch as the baby grows. The pain usually begins in the second trimester (13-28 weeks) of pregnancy, and should only last for a few seconds when it occurs. However, the pain can come and go until the baby is delivered. The pain does not cause harm to the baby. Round ligament pain is usually a short, sharp, and pinching pain, but it can also be a dull, lingering, and aching pain. The pain is felt in the lower side of the abdomen or in the groin. It usually starts deep in the groin and moves up to the outside of the hip area. The pain may happen when you: Suddenly change position, such as quickly going from a sitting to standing position. Do physical activity. Cough or sneeze. Follow these instructions at home: Managing pain  When the pain starts, relax. Then, try any of these methods to help with the pain: Sit down. Flex your knees up to your abdomen. Lie on your side with one pillow under your abdomen and another pillow between your legs. Sit in a warm bath for 15-20 minutes or until the pain goes away. General instructions Watch your condition for any changes. Move slowly when you sit down or stand up. Stop or reduce your physical activities if they cause pain. Avoid long walks if they cause pain. Take over-the-counter and prescription medicines only as told by your health care provider. Keep all follow-up visits. This is important. Contact a health care provider if: Your pain does not go away with treatment. You feel pain in your back that you did not have before. Your medicine is not helping. You have a fever or chills. You have nausea or vomiting. You have diarrhea. You have pain when you urinate. Get help right away if: You have pain that is a rhythmic, cramping pain similar to labor pains. Labor pains  are usually 2 minutes apart, last for about 1 minute, and involve a bearing down feeling or pressure in your pelvis. You have vaginal bleeding. These symptoms may represent a serious problem that is an emergency. Do not wait to see if the symptoms will go away. Get medical help right away. Call your local emergency services (911 in the [place] Do not drive yourself to the hospital. Summary Round ligament pain is felt in the lower abdomen or groin. This pain usually begins in the second trimester (13-28 weeks) and should only last for a few seconds when it occurs. You may notice the pain when you suddenly change position, when you cough or sneeze, or during physical activity. Relaxing, flexing your knees to your abdomen, lying on one side, or taking a warm bath may help to get rid of the pain. Contact your health care provider if the pain does not go away. This information is not intended to replace advice given to you by your health care provider. Make sure you discuss any questions you have with your health care provider. Document Revised: 02/10/2021 Document Reviewed: 02/10/2021 Elsevier Patient Education  2022 Elsevier Inc.  

## 2021-12-07 NOTE — Progress Notes (Signed)
[name] doing well, not sure if she is feeling movement yet. Had flu last week. Feeling better. Discussed u/s next visit for anatomy . Follow up 3 wks for u/s and [name] with Missy.   Doreene Burke, CNM

## 2021-12-12 NOTE — L&D Delivery Note (Signed)
       Delivery Note   Stephanie Walker is a 26 y.o. G2P1001 at [redacted]w[redacted]d Estimated Date of Delivery: 05/17/22  PRE-OPERATIVE DIAGNOSIS:  1) [redacted]w[redacted]d pregnancy.   POST-OPERATIVE DIAGNOSIS:  1) [redacted]w[redacted]d pregnancy s/p Vaginal, Spontaneous   Delivery Type: Vaginal, Spontaneous    Delivery Anesthesia:  none  Labor Complications:  precipitous delivery     ESTIMATED BLOOD LOSS: 100  ml    FINDINGS:   1) female infant, Apgar scores of    at 1 minute and    at 5 minutes and a birthweight pending.    2) Nuchal cord: no  SPECIMENS:   PLACENTA:   Appearance:  intact, 3 vessel cord   Removal: Spontaneous      Disposition:  per protocol   DISPOSITION:  Infant to left in stable condition in the delivery room, with L&D personnel and mother,  NARRATIVE SUMMARY: Labor course:  Ms. [name] is a G2P1001 at [redacted]w[redacted]d who presented for labor management.  She progressed well in labor without pitocin.  She received the no anesthesia and proceeded to complete dilation. She evidenced good maternal expulsive effort during the second stage. She went on to deliver a viable female infant "[name]". The placenta delivered without problems and was noted to be complete. A perineal and vaginal examination was performed. Episiotomy/Lacerations: None . The patient tolerated this well.  Doreene Burke, CNM  05/09/2022 12:08 PM

## 2021-12-29 ENCOUNTER — Other Ambulatory Visit: Payer: Self-pay

## 2021-12-29 ENCOUNTER — Ambulatory Visit (INDEPENDENT_AMBULATORY_CARE_PROVIDER_SITE_OTHER): Payer: Medicaid Other | Admitting: Obstetrics

## 2021-12-29 ENCOUNTER — Ambulatory Visit (INDEPENDENT_AMBULATORY_CARE_PROVIDER_SITE_OTHER): Payer: Medicaid Other

## 2021-12-29 VITALS — BP 102/72 | HR 83 | Wt 144.2 lb

## 2021-12-29 DIAGNOSIS — Z3482 Encounter for supervision of other normal pregnancy, second trimester: Secondary | ICD-10-CM

## 2021-12-29 DIAGNOSIS — Z3A17 17 weeks gestation of pregnancy: Secondary | ICD-10-CM

## 2021-12-29 NOTE — Progress Notes (Signed)
[name] at [redacted]w[redacted]d. Feels well. Denies LOF, vaginal bleeding, ctx. Just had anatomy US (results not yet available). Active baby. Mood is good. Reviewed physiologic changes in pregnancy. Questions answered. Plans to breastfeed. RTC in 4 weeks.  [name] Spanish, CNM

## 2022-01-03 ENCOUNTER — Encounter: Payer: Medicaid Other | Admitting: Certified Nurse Midwife

## 2022-01-26 ENCOUNTER — Encounter: Payer: Medicaid Other | Admitting: Certified Nurse Midwife

## 2022-01-26 ENCOUNTER — Ambulatory Visit (INDEPENDENT_AMBULATORY_CARE_PROVIDER_SITE_OTHER): Payer: Medicaid Other | Admitting: Certified Nurse Midwife

## 2022-01-26 ENCOUNTER — Other Ambulatory Visit: Payer: Self-pay

## 2022-01-26 VITALS — BP 117/75 | HR 87 | Wt 148.6 lb

## 2022-01-26 DIAGNOSIS — Z3482 Encounter for supervision of other normal pregnancy, second trimester: Secondary | ICD-10-CM

## 2022-01-26 DIAGNOSIS — Z3A24 24 weeks gestation of pregnancy: Secondary | ICD-10-CM

## 2022-01-26 LAB — POCT URINALYSIS DIPSTICK OB
Bilirubin, UA: NEGATIVE
Blood, UA: NEGATIVE
Glucose, UA: NEGATIVE
Ketones, UA: NEGATIVE
Leukocytes, UA: NEGATIVE
Nitrite, UA: NEGATIVE
POC,PROTEIN,UA: NEGATIVE
Spec Grav, UA: 1.01 (ref 1.010–1.025)
Urobilinogen, UA: 0.2 E.U./dL
pH, UA: 7 (ref 5.0–8.0)

## 2022-01-26 NOTE — Patient Instructions (Addendum)
Td (Tetanus, Diphtheria) Vaccine: What You Need to Know 1. Why get vaccinated? Td vaccine can prevent tetanus and diphtheria. Tetanus enters the body through cuts or wounds. Diphtheria spreads from person to person. TETANUS (T) causes painful stiffening of the muscles. Tetanus can lead to serious health problems, including being unable to open the mouth, having trouble swallowing and breathing, or death. DIPHTHERIA (D) can lead to difficulty breathing, heart failure, paralysis, or death. 2. Td vaccine Td is only for children 7 years and older, adolescents, and adults.  Td is usually given as a booster dose every 10 years, or after 5 years in the case of a severe or dirty wound or burn. Another vaccine, called "Tdap," may be used instead of Td. Tdap protects against pertussis, also known as "whooping cough," in addition to tetanus anddiphtheria. Td may be given at the same time as other vaccines. 3. Talk with your health care provider Tell your vaccination provider if the person getting the vaccine: Has had an allergic reaction after a previous dose of any vaccine that protects against tetanus or diphtheria, or has any severe, life-threatening allergies Has ever had Guillain-Barr Syndrome (also called "GBS") Has had severe pain or swelling after a previous dose of any vaccine that protects against tetanus or diphtheria In some cases, your health care provider may decide to postpone Td vaccinationuntil a future visit. People with minor illnesses, such as a cold, may be vaccinated. People who are moderately or severely ill should usually wait until they recover beforegetting Td vaccine.  Your health care provider can give you more information. 4. Risks of a vaccine reaction Pain, redness, or swelling where the shot was given, mild fever, headache, feeling tired, and nausea, vomiting, diarrhea, or stomachache sometimes happen after Td vaccination. People sometimes faint after medical procedures,  including vaccination. Tellyour provider if you feel dizzy or have vision changes or ringing in the ears.  As with any medicine, there is a very remote chance of a vaccine causing asevere allergic reaction, other serious injury, or death. 5. What if there is a serious problem? An allergic reaction could occur after the vaccinated person leaves the clinic. If you see signs of a severe allergic reaction (hives, swelling of the face and throat, difficulty breathing, a fast heartbeat, dizziness, or weakness), call 9-1-1and get the person to the nearest hospital.  For other signs that concern you, call your health care provider.  Adverse reactions should be reported to the Vaccine Adverse Event Reporting System (VAERS). Your health care provider will usually file this report, or you can do it yourself. Visit the VAERS website at www.vaers.hhs.gov or call 1-800-822-7967. VAERS is only for reporting reactions, and VAERS staff members do not give medical advice. 6. The National Vaccine Injury Compensation Program The National Vaccine Injury Compensation Program (VICP) is a federal program that was created to compensate people who may have been injured by certain vaccines. Claims regarding alleged injury or death due to vaccination have a time limit for filing, which may be as short as two years. Visit the VICP website at www.hrsa.gov/vaccinecompensation or call 1-800-338-2382to learn about the program and about filing a claim. 7. How can I learn more? Ask your health care provider. Call your local or state health department. Visit the website of the Food and Drug Administration (FDA) for vaccine package inserts and additional information at www.fda.gov/vaccines-blood-biologics/vaccines. Contact the Centers for Disease Control and Prevention (CDC): Call 1-800-232-4636 (1-800-CDC-INFO) or Visit CDC's website at www.cdc.gov/vaccines. Vaccine Information Statement   Td (Tetanus, Diphtheria) Vaccine (07/17/2020) This  information is not intended to replace advice given to you by your health care provider. Make sure you discuss any questions you have with your healthcare provider. Document Revised: 09/03/2020 Document Reviewed: 09/03/2020 Elsevier Patient Education  2022 Elsevier Inc. Oral Glucose Tolerance Test During Pregnancy Why am I having this test? The oral glucose tolerance test (OGTT) is done to check how your body processes blood sugar (glucose). This is one of several tests used to diagnose diabetes that develops during pregnancy (gestational diabetes mellitus). Gestational diabetes is a short-term form of diabetes that some women develop while they are pregnant. It usually occurs during the second trimesterof pregnancy and goes away after delivery. Testing, or screening, for gestational diabetes usually occurs at weeks 24-28 of pregnancy. You may have the OGTT test after having a 1-hour glucose screening test if the results from that test indicate that you may have gestational diabetes. This test may also be needed if: You have a history of gestational diabetes. There is a history of giving birth to very large babies or of losing pregnancies (having stillbirths). You have signs and symptoms of diabetes, such as: Changes in your eyesight. Tingling or numbness in your hands or feet. Changes in hunger, thirst, and urination, and these are not explained by your pregnancy. What is being tested? This test measures the amount of glucose in your blood at different timesduring a period of 3 hours. This shows how well your body can process glucose. What kind of sample is taken?  Blood samples are required for this test. They are usually collected byinserting a needle into a blood vessel. How do I prepare for this test? For 3 days before your test, eat normally. Have plenty of carbohydrate-rich foods. Follow instructions from your health care provider about: Eating or drinking restrictions on the day of the  test. You may be asked not to eat or drink anything other than water (to fast) starting 8-10 hours before the test. Changing or stopping your regular medicines. Some medicines may interfere with this test. Tell a health care provider about: All medicines you are taking, including vitamins, herbs, eye drops, creams, and over-the-counter medicines. Any blood disorders you have. Any surgeries you have had. Any medical conditions you have. What happens during the test? First, your blood glucose will be measured. This is referred to as your fasting blood glucose because you fasted before the test. Then, you will drink a glucose solution that contains a certain amount of glucose. Your blood glucosewill be measured again 1, 2, and 3 hours after you drink the solution. This test takes about 3 hours to complete. You will need to stay at the testing location during this time. During the testing period: Do not eat or drink anything other than the glucose solution. Do not exercise. Do not use any products that contain nicotine or tobacco, such as cigarettes, e-cigarettes, and chewing tobacco. These can affect your test results. If you need help quitting, ask your health care provider. The testing procedure may vary among health care providers and hospitals. How are the results reported? Your results will be reported as milligrams of glucose per deciliter of blood (mg/dL) or millimoles per liter (mmol/L). There is more than one source for screening and diagnosis reference values used to diagnose gestational diabetes. Your health care provider will compare your results to normal values that were established after testing a large group of people (reference values). Reference values may vary among labs   and hospitals. For this test (Carpenter-Coustan), reference values are: Fasting: 95 mg/dL (5.3 mmol/L). 1 hour: 180 mg/dL (10.0 mmol/L). 2 hour: 155 mg/dL (8.6 mmol/L). 3 hour: 140 mg/dL (7.8 mmol/L). What do the  results mean? Results below the reference values are considered normal. If two or more of your blood glucose levels are at or above the reference values, you may be diagnosed with gestational diabetes. If only one level is high, your healthcare provider may suggest repeat testing or other tests to confirm a diagnosis. Talk with your health care provider about what your results mean. Questions to ask your health care provider Ask your health care provider, or the department that is doing the test: When will my results be ready? How will I get my results? What are my treatment options? What other tests do I need? What are my next steps? Summary The oral glucose tolerance test (OGTT) is one of several tests used to diagnose diabetes that develops during pregnancy (gestational diabetes mellitus). Gestational diabetes is a short-term form of diabetes that some women develop while they are pregnant. You may have the OGTT test after having a 1-hour glucose screening test if the results from that test show that you may have gestational diabetes. You may also have this test if you have any symptoms or risk factors for this type of diabetes. Talk with your health care provider about what your results mean. This information is not intended to replace advice given to you by your health care provider. Make sure you discuss any questions you have with your healthcare provider. Document Revised: 05/07/2020 Document Reviewed: 05/07/2020 Elsevier Patient Education  2022 Elsevier Inc.  

## 2022-01-26 NOTE — Progress Notes (Signed)
[name] doing well, feeling lots of movement. Discussed 28 wks labs next visit. Information sheet on how to eat prior to testing given. She verbalizes and agrees to plan. Follow up 4 wks with Missy.   Doreene Burke, CNM

## 2022-02-02 ENCOUNTER — Encounter: Payer: Medicaid Other | Admitting: Certified Nurse Midwife

## 2022-02-23 ENCOUNTER — Other Ambulatory Visit: Payer: BC Managed Care – PPO

## 2022-02-23 ENCOUNTER — Ambulatory Visit (INDEPENDENT_AMBULATORY_CARE_PROVIDER_SITE_OTHER): Payer: BC Managed Care – PPO | Admitting: Obstetrics

## 2022-02-23 ENCOUNTER — Other Ambulatory Visit: Payer: Self-pay

## 2022-02-23 VITALS — BP 118/76 | Wt 157.0 lb

## 2022-02-23 DIAGNOSIS — Z3A28 28 weeks gestation of pregnancy: Secondary | ICD-10-CM

## 2022-02-23 DIAGNOSIS — Z23 Encounter for immunization: Secondary | ICD-10-CM | POA: Diagnosis not present

## 2022-02-23 NOTE — Progress Notes (Signed)
[name] at [redacted]w[redacted]d. Active baby. Discussed third trimester discomforts and what to expect in labor. RSB, BTC, TDaP today. Plans Nexplanon for contraception. Plans to breastfeed. Breastfed her daughter for 5-6 months, but then her daughter had some severe digestive issues requiring hospitalization and feeding tube. 1-hour glucose, CBC, RPR today. RTC in 2 weeks. ? ?Lloyd Huger, CNM ?

## 2022-02-23 NOTE — Progress Notes (Signed)
28 week labs, no vb. No lof. BT consent today. TDAp today  ?

## 2022-02-24 ENCOUNTER — Encounter: Payer: Self-pay | Admitting: Obstetrics

## 2022-02-24 LAB — CBC
Hematocrit: 35.6 % (ref 34.0–46.6)
Hemoglobin: 12.4 g/dL (ref 11.1–15.9)
MCH: 31 pg (ref 26.6–33.0)
MCHC: 34.8 g/dL (ref 31.5–35.7)
MCV: 89 fL (ref 79–97)
Platelets: 134 10*3/uL — ABNORMAL LOW (ref 150–450)
RBC: 4 x10E6/uL (ref 3.77–5.28)
RDW: 12.4 % (ref 11.7–15.4)
WBC: 8.2 10*3/uL (ref 3.4–10.8)

## 2022-02-24 LAB — RPR: RPR Ser Ql: NONREACTIVE

## 2022-02-24 LAB — GLUCOSE, 1 HOUR GESTATIONAL: Gestational Diabetes Screen: 137 mg/dL (ref 70–139)

## 2022-03-09 ENCOUNTER — Ambulatory Visit (INDEPENDENT_AMBULATORY_CARE_PROVIDER_SITE_OTHER): Payer: BC Managed Care – PPO | Admitting: Certified Nurse Midwife

## 2022-03-09 ENCOUNTER — Encounter: Payer: Self-pay | Admitting: Certified Nurse Midwife

## 2022-03-09 VITALS — BP 119/71 | HR 91 | Wt 162.4 lb

## 2022-03-09 DIAGNOSIS — Z3483 Encounter for supervision of other normal pregnancy, third trimester: Secondary | ICD-10-CM

## 2022-03-09 DIAGNOSIS — Z3A30 30 weeks gestation of pregnancy: Secondary | ICD-10-CM

## 2022-03-09 LAB — POCT URINALYSIS DIPSTICK OB
Bilirubin, UA: NEGATIVE
Blood, UA: NEGATIVE
Glucose, UA: NEGATIVE
Ketones, UA: NEGATIVE
Leukocytes, UA: NEGATIVE
Nitrite, UA: NEGATIVE
POC,PROTEIN,UA: NEGATIVE
Spec Grav, UA: 1.015 (ref 1.010–1.025)
Urobilinogen, UA: 0.2 E.U./dL
pH, UA: 7 (ref 5.0–8.0)

## 2022-03-09 NOTE — Progress Notes (Signed)
[name] doing well, feeling good movement.  Discussed upcoming merging of practices. Pt verbalize understanding. She denies any question or issues. Follow up with Missy 2 wk. ? ?Doreene Burke, CNM  ?

## 2022-03-09 NOTE — Progress Notes (Signed)
Pt doing good, no concerns voiced at this time.  ?

## 2022-03-09 NOTE — Patient Instructions (Signed)
[place] [facility]  [facility]  [address], [place], [place] [address]  Phone: [phone]  [facility] (second location)  [address] [place], [place] [address]  Phone: [phone]  [facility] ([place]) [address], [place], [place] [address] Phone: [phone]  [facility]  [address] [place], [place] [address]  Phone: [phone] 

## 2022-03-21 ENCOUNTER — Ambulatory Visit (INDEPENDENT_AMBULATORY_CARE_PROVIDER_SITE_OTHER): Payer: BC Managed Care – PPO | Admitting: Obstetrics

## 2022-03-21 ENCOUNTER — Encounter: Payer: Self-pay | Admitting: Obstetrics

## 2022-03-21 VITALS — BP 120/77 | HR 79 | Wt 164.8 lb

## 2022-03-21 DIAGNOSIS — Z3A31 31 weeks gestation of pregnancy: Secondary | ICD-10-CM

## 2022-03-21 DIAGNOSIS — Z3483 Encounter for supervision of other normal pregnancy, third trimester: Secondary | ICD-10-CM

## 2022-03-21 LAB — POCT URINALYSIS DIPSTICK OB
Bilirubin, UA: NEGATIVE
Blood, UA: NEGATIVE
Glucose, UA: NEGATIVE
Ketones, UA: NEGATIVE
Leukocytes, UA: NEGATIVE
Nitrite, UA: NEGATIVE
POC,PROTEIN,UA: NEGATIVE
Spec Grav, UA: 1.015 (ref 1.010–1.025)
Urobilinogen, UA: 0.2 E.U./dL
pH, UA: 6.5 (ref 5.0–8.0)

## 2022-03-21 NOTE — Progress Notes (Signed)
Stephanie Walker at [redacted]w[redacted]d. Active baby. Having some BH ctx. Denies vaginal bleeding, LOF. Waking up multiple times in the night to urinate but otherwise feeling well. Discussed third trimester discomforts, when to go to the hospital. RTC in 2 weeks. ? ?[name] Spanish, CNM ?

## 2022-03-23 ENCOUNTER — Encounter: Payer: BC Managed Care – PPO | Admitting: Obstetrics

## 2022-04-05 ENCOUNTER — Encounter: Payer: Self-pay | Admitting: Certified Nurse Midwife

## 2022-04-05 ENCOUNTER — Ambulatory Visit (INDEPENDENT_AMBULATORY_CARE_PROVIDER_SITE_OTHER): Payer: BC Managed Care – PPO | Admitting: Certified Nurse Midwife

## 2022-04-05 VITALS — BP 116/75 | HR 94 | Wt 169.6 lb

## 2022-04-05 DIAGNOSIS — Z3A34 34 weeks gestation of pregnancy: Secondary | ICD-10-CM

## 2022-04-05 DIAGNOSIS — Z3483 Encounter for supervision of other normal pregnancy, third trimester: Secondary | ICD-10-CM

## 2022-04-05 LAB — POCT URINALYSIS DIPSTICK OB
Bilirubin, UA: NEGATIVE
Blood, UA: NEGATIVE
Glucose, UA: NEGATIVE
Ketones, UA: NEGATIVE
Leukocytes, UA: NEGATIVE
Nitrite, UA: NEGATIVE
Spec Grav, UA: 1.025 (ref 1.010–1.025)
Urobilinogen, UA: 0.2 E.U./dL
pH, UA: 6.5 (ref 5.0–8.0)

## 2022-04-05 NOTE — Patient Instructions (Signed)
Preterm Labor Pregnancy normally lasts 39-41 weeks. Preterm labor is when labor starts before you have been pregnant for 37 weeks. Babies who are born too early may have a higher risk for long-term problems like cerebral palsy or developmental delays. They may also have problems soon after birth, such as problems with blood sugar, body temperature, heart, and breathing. These problems may be very serious in babies who are born before 34 weeks of pregnancy. What are the causes? The cause of this condition is not known. What increases the risk? You are more likely to have preterm labor if: You have medical problems, now or in the past. You have problems now or in your past pregnancies. You have lifestyle problems. Medical history You have problems of the womb (uterus). You have an infection, including infections you get from sex. You have problems that do not go away, such as: Blood clots. High blood pressure. High blood sugar. You have low body weight or too much body weight. Present and past pregnancies You have had preterm labor before. You are pregnant with two babies or more. You have a condition in which the placenta covers your cervix. You waited less than 18 months between giving birth and becoming pregnant again. Your unborn baby has some problems. You have bleeding from your vagina. You became pregnant by a method called IVF. Lifestyle You smoke. You drink alcohol. You use drugs. You have stress. You have abuse in your home. You come in contact with chemicals that harm the body (pollutants). Other factors You are younger than 17 years or older than 35 years. What are the signs or symptoms? Symptoms of this condition include: Cramps. The cramps may feel like cramps from a period. You may also have watery poop (diarrhea). Pain in the belly (abdomen). Pain in the lower back. Regular contractions. It may feel like your belly is getting tighter. Pressure in the lower  belly. More fluid leaking from the vagina. The fluid may be watery or bloody. Water breaking. How is this treated? Treatment for this condition depends on your health, the health of your baby, and how old your pregnancy is. It may include: Taking medicines, such as: Hormone medicines. Medicines to stop contractions. Medicines to help mature the baby's lungs. Medicines to prevent your baby from getting cerebral palsy or other problems. Bed rest. If the labor happens before 34 weeks of pregnancy, you may need to stay in the hospital. Delivering the baby. Follow these instructions at home:  Do not smoke or use any products that contain nicotine or tobacco. If you need help quitting, ask your doctor. Do not drink alcohol. Take over-the-counter and prescription medicines only as told by your doctor. Rest as told by your doctor. Return to your normal activities when your doctor says that it is safe. Keep all follow-up visits. How is this prevented? To have a healthy pregnancy: Do not use drugs. Do not use any medicines unless you ask your doctor if they are safe for you. Talk with your doctor before taking any herbal supplements. Make sure you gain enough weight. Watch for infection. If you think you might have an infection, get it checked right away. Symptoms of infection may include: Fever. Vaginal discharge that smells bad or is not normal. Pain or burning when you pee. Needing to pee urgently. Needing to pee often. Peeing small amounts often. Blood in your pee. Pee that smells bad or unusual. Where to find more information [place]. Department of Health and Human Services   Office on Women's Health: www.womenshealth.gov The American College of Obstetricians and Gynecologists: www.acog.org Centers for Disease Control and Prevention: www.cdc.gov Contact a doctor if: You think you are going into preterm labor. You have symptoms of preterm labor. You have symptoms of infection. Get help  right away if: You are having painful contractions every 5 minutes or less. Your water breaks. Summary Preterm labor is labor that starts before you reach 37 weeks of pregnancy. Your baby may have problems if delivered early. You are more likely to have preterm labor if you have certain medical problems or problems with a pregnancy now or in the past. Some lifestyle factors can also increase the risk. Contact a doctor if you have symptoms of preterm labor. This information is not intended to replace advice given to you by your health care provider. Make sure you discuss any questions you have with your health care provider. Document Revised: 12/01/2020 Document Reviewed: 12/01/2020 Elsevier Patient Education  2023 Elsevier Inc.  

## 2022-04-05 NOTE — Progress Notes (Signed)
[name] doing well, feeling good movement. Reviewed GBS testing next visit. She verbalizes and agrees. Follow up 2 wks with Stephanie Walker.  ? ?Doreene Burke, CNM  ?

## 2022-04-22 ENCOUNTER — Encounter: Payer: Self-pay | Admitting: Obstetrics

## 2022-04-22 ENCOUNTER — Ambulatory Visit (INDEPENDENT_AMBULATORY_CARE_PROVIDER_SITE_OTHER): Payer: BC Managed Care – PPO | Admitting: Obstetrics

## 2022-04-22 VITALS — BP 115/75 | HR 99 | Wt 171.1 lb

## 2022-04-22 DIAGNOSIS — O99119 Other diseases of the blood and blood-forming organs and certain disorders involving the immune mechanism complicating pregnancy, unspecified trimester: Secondary | ICD-10-CM

## 2022-04-22 DIAGNOSIS — D696 Thrombocytopenia, unspecified: Secondary | ICD-10-CM

## 2022-04-22 DIAGNOSIS — Z3A36 36 weeks gestation of pregnancy: Secondary | ICD-10-CM

## 2022-04-22 DIAGNOSIS — Z3483 Encounter for supervision of other normal pregnancy, third trimester: Secondary | ICD-10-CM

## 2022-04-22 LAB — POCT URINALYSIS DIPSTICK OB
Bilirubin, UA: NEGATIVE
Blood, UA: NEGATIVE
Glucose, UA: NEGATIVE
Ketones, UA: NEGATIVE
Leukocytes, UA: NEGATIVE
Nitrite, UA: NEGATIVE
POC,PROTEIN,UA: NEGATIVE
Spec Grav, UA: 1.015 (ref 1.010–1.025)
Urobilinogen, UA: 0.2 E.U./dL
pH, UA: 6 (ref 5.0–8.0)

## 2022-04-22 NOTE — Progress Notes (Signed)
[name] at [redacted]w[redacted]d. Good fetal movement. Denies ctx, LOF, vaginal bleeding. Reviewed when to go to the hospital, preparations at home. CBC today to recheck platelets. GBS and GC/chlamydia collected today. Given note for employer with EDD. RTC in one week. ? ?[name] Spanish, CNM ?

## 2022-04-23 LAB — CBC
Hematocrit: 34.2 % (ref 34.0–46.6)
Hemoglobin: 11.9 g/dL (ref 11.1–15.9)
MCH: 29.7 pg (ref 26.6–33.0)
MCHC: 34.8 g/dL (ref 31.5–35.7)
MCV: 85 fL (ref 79–97)
Platelets: 167 10*3/uL (ref 150–450)
RBC: 4.01 x10E6/uL (ref 3.77–5.28)
RDW: 13.1 % (ref 11.7–15.4)
WBC: 9.8 10*3/uL (ref 3.4–10.8)

## 2022-04-24 LAB — STREP GP B NAA: Strep Gp B NAA: NEGATIVE

## 2022-04-25 ENCOUNTER — Encounter: Payer: Self-pay | Admitting: Obstetrics

## 2022-04-25 LAB — GC/CHLAMYDIA PROBE AMP
Chlamydia trachomatis, NAA: NEGATIVE
Neisseria Gonorrhoeae by PCR: NEGATIVE

## 2022-04-28 ENCOUNTER — Encounter: Payer: Self-pay | Admitting: Certified Nurse Midwife

## 2022-04-28 ENCOUNTER — Ambulatory Visit (INDEPENDENT_AMBULATORY_CARE_PROVIDER_SITE_OTHER): Payer: BC Managed Care – PPO | Admitting: Certified Nurse Midwife

## 2022-04-28 VITALS — BP 109/72 | HR 98 | Wt 171.0 lb

## 2022-04-28 DIAGNOSIS — Z3A37 37 weeks gestation of pregnancy: Secondary | ICD-10-CM

## 2022-04-28 LAB — POCT URINALYSIS DIPSTICK OB
Bilirubin, UA: NEGATIVE
Blood, UA: NEGATIVE
Glucose, UA: NEGATIVE
Ketones, UA: NEGATIVE
Leukocytes, UA: NEGATIVE
Nitrite, UA: 0.2
Urobilinogen, UA: NEGATIVE E.U./dL — AB
pH, UA: 5 (ref 5.0–8.0)

## 2022-04-28 NOTE — Progress Notes (Signed)
[name] doing well, feeling good movement. Discussed GBS results. Reviewed labor precautions. SVE per pt request. 3/60/-2. Herbal prep hand out given.   Follow up 1 wk for [name].   Doreene Burke, CNM

## 2022-04-28 NOTE — Patient Instructions (Signed)
Braxton Hicks Contractions  Contractions of the uterus can occur throughout pregnancy, but they are not always a sign that you are in labor. You may have practice contractions called Braxton Hicks contractions. These false labor contractions are sometimes confused with true labor. What are Braxton Hicks contractions? Braxton Hicks contractions are tightening movements that occur in the muscles of the uterus before labor. Unlike true labor contractions, these contractions do not result in opening (dilation) and thinning of the lowest part of the uterus (cervix). Toward the end of pregnancy (32-34 weeks), Braxton Hicks contractions can happen more often and may become stronger. These contractions are sometimes difficult to tell apart from true labor because they can be very uncomfortable. How to tell the difference between true labor and false labor True labor Contractions last 30-70 seconds. Contractions become very regular. Discomfort is usually felt in the top of the uterus, and it spreads to the lower abdomen and low back. Contractions do not go away with walking. Contractions usually become stronger and more frequent. The cervix dilates and gets thinner. False labor Contractions are usually shorter, weaker, and farther apart than true labor contractions. Contractions are usually irregular. Contractions are often felt in the front of the lower abdomen and in the groin. Contractions may go away when you walk around or change positions while lying down. The cervix usually does not dilate or become thin. Sometimes, the only way to tell if you are in true labor is for your health care provider to look for changes in your cervix. Your health care provider will do a physical exam and may monitor your contractions. If you are in true labor, your health care provider will send you home with instructions about when to return to the hospital. You may continue to have Braxton Hicks contractions until you  go into true labor. Follow these instructions at home:  Take over-the-counter and prescription medicines only as told by your health care provider. If Braxton Hicks contractions are making you uncomfortable: Change your position from lying down or resting to walking, or change from walking to resting. Sit and rest in a tub of warm water. Drink enough fluid to keep your urine pale yellow. Dehydration may cause these contractions. Do slow and deep breathing several times an hour. Keep all follow-up visits. This is important. Contact a health care provider if: You have a fever. You have continuous pain in your abdomen. Your contractions become stronger, more regular, and closer together. You pass blood-tinged mucus. Get help right away if: You have fluid leaking or gushing from your vagina. You have bright red blood coming from your vagina. Your baby is not moving inside you as much as it used to. Summary You may have practice contractions called Braxton Hicks contractions. These false labor contractions are sometimes confused with true labor. Braxton Hicks contractions are usually shorter, weaker, farther apart, and less regular than true labor contractions. True labor contractions usually become stronger, more regular, and more frequent. Manage discomfort from Braxton Hicks contractions by changing position, resting in a warm bath, practicing deep breathing, and drinking plenty of water. Keep all follow-up visits. Contact your health care provider if your contractions become stronger, more regular, and closer together. This information is not intended to replace advice given to you by your health care provider. Make sure you discuss any questions you have with your health care provider. Document Revised: 10/05/2020 Document Reviewed: 10/05/2020 Elsevier Patient Education  2023 Elsevier Inc.  

## 2022-04-28 NOTE — Progress Notes (Signed)
[name]- cervix check 

## 2022-04-28 NOTE — Addendum Note (Signed)
Addended by: Donnetta Hail on: 04/28/2022 09:56 AM   Modules accepted: Orders

## 2022-05-03 ENCOUNTER — Ambulatory Visit (INDEPENDENT_AMBULATORY_CARE_PROVIDER_SITE_OTHER): Payer: BC Managed Care – PPO | Admitting: Certified Nurse Midwife

## 2022-05-03 ENCOUNTER — Encounter: Payer: Self-pay | Admitting: Certified Nurse Midwife

## 2022-05-03 VITALS — BP 123/74 | HR 86 | Wt 169.7 lb

## 2022-05-03 DIAGNOSIS — Z3483 Encounter for supervision of other normal pregnancy, third trimester: Secondary | ICD-10-CM

## 2022-05-03 DIAGNOSIS — Z3A38 38 weeks gestation of pregnancy: Secondary | ICD-10-CM

## 2022-05-03 LAB — POCT URINALYSIS DIPSTICK OB
Bilirubin, UA: NEGATIVE
Blood, UA: NEGATIVE
Glucose, UA: NEGATIVE
Ketones, UA: NEGATIVE
Leukocytes, UA: NEGATIVE
Nitrite, UA: NEGATIVE
POC,PROTEIN,UA: NEGATIVE
Spec Grav, UA: 1.02 (ref 1.010–1.025)
Urobilinogen, UA: 0.2 E.U./dL
pH, UA: 7 (ref 5.0–8.0)

## 2022-05-03 NOTE — Progress Notes (Signed)
[name] doing well, feeling good movement . Labor precautions reviewed. SVE per pt request 4/60/-2. Follow up 1 wk or prn.   Doreene Burke, CNM

## 2022-05-03 NOTE — Patient Instructions (Addendum)
Braxton Hicks Contractions  Contractions of the uterus can occur throughout pregnancy, but they are not always a sign that you are in labor. You may have practice contractions called Braxton Hicks contractions. These false labor contractions are sometimes confused with true labor. What are Deberah Pelton contractions? Braxton Hicks contractions are tightening movements that occur in the muscles of the uterus before labor. Unlike true labor contractions, these contractions do not result in opening (dilation) and thinning of the lowest part of the uterus (cervix). Toward the end of pregnancy (32-34 weeks), Braxton Hicks contractions can happen more often and may become stronger. These contractions are sometimes difficult to tell apart from true labor because they can be very uncomfortable. How to tell the difference between true labor and false labor True labor Contractions last 30-70 seconds. Contractions become very regular. Discomfort is usually felt in the top of the uterus, and it spreads to the lower abdomen and low back. Contractions do not go away with walking. Contractions usually become stronger and more frequent. The cervix dilates and gets thinner. False labor Contractions are usually shorter, weaker, and farther apart than true labor contractions. Contractions are usually irregular. Contractions are often felt in the front of the lower abdomen and in the groin. Contractions may go away when you walk around or change positions while lying down. The cervix usually does not dilate or become thin. Sometimes, the only way to tell if you are in true labor is for your health care provider to look for changes in your cervix. Your health care provider will do a physical exam and may monitor your contractions. If you are in true labor, your health care provider will send you home with instructions about when to return to the hospital. You may continue to have Braxton Hicks contractions until you  go into true labor. Follow these instructions at home:  Take over-the-counter and prescription medicines only as told by your health care provider. If Braxton Hicks contractions are making you uncomfortable: Change your position from lying down or resting to walking, or change from walking to resting. Sit and rest in a tub of warm water. Drink enough fluid to keep your urine pale yellow. Dehydration may cause these contractions. Do slow and deep breathing several times an hour. Keep all follow-up visits. This is important. Contact a health care provider if: You have a fever. You have continuous pain in your abdomen. Your contractions become stronger, more regular, and closer together. You pass blood-tinged mucus. Get help right away if: You have fluid leaking or gushing from your vagina. You have bright red blood coming from your vagina. Your baby is not moving inside you as much as it used to. Summary You may have practice contractions called Braxton Hicks contractions. These false labor contractions are sometimes confused with true labor. Braxton Hicks contractions are usually shorter, weaker, farther apart, and less regular than true labor contractions. True labor contractions usually become stronger, more regular, and more frequent. Manage discomfort from [facility] contractions by changing position, resting in a warm bath, practicing deep breathing, and drinking plenty of water. Keep all follow-up visits. Contact your health care provider if your contractions become stronger, more regular, and closer together. This information is not intended to replace advice given to you by your health care provider. Make sure you discuss any questions you have with your health care provider. Document Revised: 10/05/2020 Document Reviewed: 10/05/2020 Elsevier Patient Education  2023 Elsevier Inc. Round Ligament Pain  The round ligaments  are a pair of cord-like tissues that help support the  uterus. They can become a source of pain during pregnancy as the ligaments soften and stretch as the baby grows. The pain usually begins in the second trimester (13-28 weeks) of pregnancy, and should only last for a few seconds when it occurs. However, the pain can come and go until the baby is delivered. The pain does not cause harm to the baby. Round ligament pain is usually a short, sharp, and pinching pain, but it can also be a dull, lingering, and aching pain. The pain is felt in the lower side of the abdomen or in the groin. It usually starts deep in the groin and moves up to the outside of the hip area. The pain may happen when you: Suddenly change position, such as quickly going from a sitting to standing position. Do physical activity. Cough or sneeze. Follow these instructions at home: Managing pain  When the pain starts, relax. Then, try any of these methods to help with the pain: Sit down. Flex your knees up to your abdomen. Lie on your side with one pillow under your abdomen and another pillow between your legs. Sit in a warm bath for 15-20 minutes or until the pain goes away. General instructions Watch your condition for any changes. Move slowly when you sit down or stand up. Stop or reduce your physical activities if they cause pain. Avoid long walks if they cause pain. Take over-the-counter and prescription medicines only as told by your health care provider. Keep all follow-up visits. This is important. Contact a health care provider if: Your pain does not go away with treatment. You feel pain in your back that you did not have before. Your medicine is not helping. You have a fever or chills. You have nausea or vomiting. You have diarrhea. You have pain when you urinate. Get help right away if: You have pain that is a rhythmic, cramping pain similar to labor pains. Labor pains are usually 2 minutes apart, last for about 1 minute, and involve a bearing down feeling or  pressure in your pelvis. You have vaginal bleeding. These symptoms may represent a serious problem that is an emergency. Do not wait to see if the symptoms will go away. Get medical help right away. Call your local emergency services (911 in the [place] Do not drive yourself to the hospital. Summary Round ligament pain is felt in the lower abdomen or groin. This pain usually begins in the second trimester (13-28 weeks) and should only last for a few seconds when it occurs. You may notice the pain when you suddenly change position, when you cough or sneeze, or during physical activity. Relaxing, flexing your knees to your abdomen, lying on one side, or taking a warm bath may help to get rid of the pain. Contact your health care provider if the pain does not go away. This information is not intended to replace advice given to you by your health care provider. Make sure you discuss any questions you have with your health care provider. Document Revised: 02/10/2021 Document Reviewed: 02/10/2021 Elsevier Patient Education  2023 [employer].

## 2022-05-04 ENCOUNTER — Other Ambulatory Visit: Payer: Self-pay

## 2022-05-04 ENCOUNTER — Observation Stay
Admission: EM | Admit: 2022-05-04 | Discharge: 2022-05-04 | Disposition: A | Discharge status: Home / Self Care | Payer: BC Managed Care – PPO | Attending: Obstetrics and Gynecology | Admitting: Obstetrics and Gynecology

## 2022-05-04 ENCOUNTER — Encounter: Payer: Self-pay | Admitting: Obstetrics and Gynecology

## 2022-05-04 ENCOUNTER — Encounter: Payer: Self-pay | Admitting: Certified Nurse Midwife

## 2022-05-04 DIAGNOSIS — Z3A38 38 weeks gestation of pregnancy: Secondary | ICD-10-CM | POA: Insufficient documentation

## 2022-05-04 DIAGNOSIS — O4193X0 Disorder of amniotic fluid and membranes, unspecified, third trimester, not applicable or unspecified: Principal | ICD-10-CM | POA: Insufficient documentation

## 2022-05-04 DIAGNOSIS — O26893 Other specified pregnancy related conditions, third trimester: Secondary | ICD-10-CM | POA: Diagnosis not present

## 2022-05-04 LAB — RUPTURE OF MEMBRANE (ROM)PLUS: Rom Plus: POSITIVE

## 2022-05-04 NOTE — Discharge Summary (Signed)
RN reviewed discharge instructions with patient. Gave pt opportunity for questions,. All questions answered at this time. Pt verbalized understanding. Pt discharged home with her mother.

## 2022-05-04 NOTE — Progress Notes (Signed)
PT does not appear to be grossly ruptured. ROM plus sent, however pt had vaginal bleeding, so RN informed the test could come back a false positive. In this case, we would perform a fern test as that is a more accurate test for rupture of membranes.

## 2022-05-04 NOTE — Progress Notes (Signed)
RN and Higinio Roger, CNM at the bedside to collect sample for fern testing. The [facility] test was negative. CNM went over signs of labor and rupture of membranes with patient. Fetal monitoring removed at this time for pt to prepare for discharge.

## 2022-05-04 NOTE — Discharge Summary (Signed)
Physician Final Progress Note  Patient ID: Stephanie Walker MRN: [id number] DOB/AGE: 08-Jun-1996 26 y.o.  Admit date: 05/04/2022 Admitting provider: Tresea MallJane Ardena Gangl, CNM Discharge date: 05/04/2022   Admission Diagnoses:  1) intrauterine pregnancy at 2156w1d  2) fluid leaking  Discharge Diagnoses:  Active Problems:   Labor and delivery, indication for care   [redacted] weeks gestation of pregnancy    History of Present Illness: The patient is a 26 y.o. female G2P1001 at 4156w1d who presents for leaking fluid that began at noon today. She reports a cervical sweep in the office yesterday and soaking through underwear and panty liners today. She is having contractions every 5-7 minutes. She has not noticed any bleeding. She was admitted for observation, placed on monitors, cervical exam, ROM plus and Fern slide.  She is not grossly ruptured, however, thin white discharge is seen at introitus. ROM plus was positive although there was blood on the swab which can cause a false positive. Specimen collected for ferning which was negative. Patient appears to be very comfortable and not in labor. She is discharged to home with instructions and precautions.    Past Medical History:  Diagnosis Date   Acne    Oligomenorrhea     Past Surgical History:  Procedure Laterality Date   NO PAST SURGERIES      No current facility-administered medications on file prior to encounter.   Current Outpatient Medications on File Prior to Encounter  Medication Sig Dispense Refill   Prenatal Vit-Fe Fumarate-FA (PRENATAL PO) Take by mouth.      No Known Allergies  Social History   Socioeconomic History   Marital status: Single    Spouse name: Not on file   Number of children: Not on file   Years of education: Not on file   Highest education level: Not on file  Occupational History   Not on file  Tobacco Use   Smoking status: Never   Smokeless tobacco: Never  Vaping Use   Vaping Use: Never used  Substance and  Sexual Activity   Alcohol use: Not Currently   Drug use: No   Sexual activity: Not Currently    Birth control/protection: Implant    Comment: nexplanon  Other Topics Concern   Not on file  Social History Narrative   Not on file   Social Determinants of Health   Financial Resource Strain: Not on file  Food Insecurity: Not on file  Transportation Needs: Not on file  Physical Activity: Not on file  Stress: Not on file  Social Connections: Not on file  Intimate Partner Violence: Not on file    Family History  Problem Relation Age of Onset   Hypertension Mother    Breast cancer Mother    Ovarian cancer Neg Hx    Colon cancer Neg Hx    Diabetes Neg Hx      Review of Systems  Constitutional:  Negative for chills and fever.  HENT:  Negative for congestion, ear discharge, ear pain, hearing loss, sinus pain and sore throat.   Eyes:  Negative for blurred vision and double vision.  Respiratory:  Negative for cough, shortness of breath and wheezing.   Cardiovascular:  Negative for chest pain, palpitations and leg swelling.  Gastrointestinal:  Negative for abdominal pain, blood in stool, constipation, diarrhea, heartburn, melena, nausea and vomiting.  Genitourinary:  Negative for dysuria, flank pain, frequency, hematuria and urgency.       Positive for fluid leaking  Musculoskeletal:  Negative for back pain,  joint pain and myalgias.  Skin:  Negative for itching and rash.  Neurological:  Negative for dizziness, tingling, tremors, sensory change, speech change, focal weakness, seizures, loss of consciousness, weakness and headaches.  Endo/Heme/Allergies:  Negative for environmental allergies. Does not bruise/bleed easily.  Psychiatric/Behavioral:  Negative for depression, hallucinations, memory loss, substance abuse and suicidal ideas. The patient is not nervous/anxious and does not have insomnia.     Physical Exam: BP 128/84 (BP Location: Right Arm)   Pulse 99   Temp 98.3 F (36.8  C) (Oral)   Resp 16   Ht 5\' 2"  (1.575 m)   Wt 77.1 kg   LMP 08/10/2021 (Approximate)   SpO2 100%   BMI 31.09 kg/m   Constitutional: Well nourished, well developed female in no acute distress.  HEENT: normal Skin: Warm and dry.  Cardiovascular: Regular rate and rhythm.   Extremity:  no edema   Respiratory: Clear to auscultation bilateral. Normal respiratory effort Abdomen: FHT present Back: no CVAT Neuro: DTRs 2+, Cranial nerves grossly intact Psych: Alert and Oriented x3. No memory deficits. Normal mood and affect.   Cervical exam: per RN B. 08/12/2021; 4/60/-2  Consults: None  Significant Findings/ Diagnostic Studies: labs:   05/04/22 15:32  Rom Plus POSITIVE   Fern: negative  Procedures: NST  Hospital Course: The patient was admitted to Labor and Delivery Triage for observation.   Discharge Condition: good  Disposition: Discharge disposition: 01-Home or Self Care  Diet: Regular diet  Discharge Activity: Activity as tolerated  Discharge Instructions     Discharge activity:  No Restrictions   Complete by: As directed    Discharge diet:  No restrictions   Complete by: As directed    LABOR:  When conractions begin, you should start to time them from the beginning of one contraction to the beginning  of the next.  When contractions are 5 - 10 minutes apart or less and have been regular for at least an hour, you should call your health care provider.   Complete by: As directed    No sexual activity restrictions   Complete by: As directed    Notify physician for bleeding from the vagina   Complete by: As directed    Notify physician for blurring of vision or spots before the eyes   Complete by: As directed    Notify physician for chills or fever   Complete by: As directed    Notify physician for fainting spells, "black outs" or loss of consciousness   Complete by: As directed    Notify physician for increase in vaginal discharge   Complete by: As directed     Notify physician for leaking of fluid   Complete by: As directed    Notify physician for pain or burning when urinating   Complete by: As directed    Notify physician for pelvic pressure (sudden increase)   Complete by: As directed    Notify physician for severe or continued nausea or vomiting   Complete by: As directed    Notify physician for sudden gushing of fluid from the vagina (with or without continued leaking)   Complete by: As directed    Notify physician for sudden, constant, or occasional abdominal pain   Complete by: As directed    Notify physician if baby moving less than usual   Complete by: As directed       Allergies as of 05/04/2022   No Known Allergies      Medication List  TAKE these medications    PRENATAL PO Take by mouth.        Total time spent taking care of this patient: 28 minutes  Signed: Tresea Mall, CNM  05/04/2022, 4:43 PM

## 2022-05-04 NOTE — OB Triage Note (Signed)
Pt presents stating her water broke around 12pm today (clear in color). Pt states she was at the doctors office yesterday and they swept her membranes. Pt states she has soaked through several pads, and shorts, and underwear. Pt reports having ctx every 5-7 mins apart. PT denies any bleeding. Reports positive fetal movement. VSS. Will continue to monitor.

## 2022-05-06 ENCOUNTER — Encounter: Payer: Self-pay | Admitting: Certified Nurse Midwife

## 2022-05-06 ENCOUNTER — Encounter: Payer: Self-pay | Admitting: Obstetrics

## 2022-05-06 ENCOUNTER — Other Ambulatory Visit (HOSPITAL_COMMUNITY)
Admission: RE | Admit: 2022-05-06 | Discharge: 2022-05-06 | Disposition: A | Discharge status: Home / Self Care | Payer: BC Managed Care – PPO | Source: Ambulatory Visit | Attending: Obstetrics | Admitting: Obstetrics

## 2022-05-06 ENCOUNTER — Other Ambulatory Visit: Payer: Self-pay | Admitting: Obstetrics

## 2022-05-06 ENCOUNTER — Ambulatory Visit (INDEPENDENT_AMBULATORY_CARE_PROVIDER_SITE_OTHER): Payer: BC Managed Care – PPO | Admitting: Obstetrics

## 2022-05-06 ENCOUNTER — Telehealth: Payer: Self-pay | Admitting: Obstetrics

## 2022-05-06 VITALS — BP 119/84 | HR 94 | Wt 172.0 lb

## 2022-05-06 DIAGNOSIS — O26893 Other specified pregnancy related conditions, third trimester: Secondary | ICD-10-CM | POA: Insufficient documentation

## 2022-05-06 DIAGNOSIS — R300 Dysuria: Secondary | ICD-10-CM | POA: Diagnosis present

## 2022-05-06 DIAGNOSIS — Z3A00 Weeks of gestation of pregnancy not specified: Secondary | ICD-10-CM | POA: Diagnosis not present

## 2022-05-06 DIAGNOSIS — Z3A38 38 weeks gestation of pregnancy: Secondary | ICD-10-CM

## 2022-05-06 LAB — POCT URINALYSIS DIPSTICK OB
Bilirubin, UA: NEGATIVE
Glucose, UA: NEGATIVE
Ketones, UA: NEGATIVE
Leukocytes, UA: NEGATIVE
Nitrite, UA: NEGATIVE
POC,PROTEIN,UA: NEGATIVE
Spec Grav, UA: 1.02 (ref 1.010–1.025)
Urobilinogen, UA: 0.2 E.U./dL
pH, UA: 6 (ref 5.0–8.0)

## 2022-05-06 NOTE — Progress Notes (Signed)
Here today to evaluate dysuria and spotting. Stephanie Walker reports that for the past 24 hours, she has had spotting that is pink and bright red. She notices when she wipes and also on her underwear. She is also having burning when she urinates. She has not had intercourse since the start of her pregnancy. She denies leaking any fluid since the two gushes she had previously. She is having sporadic contractions. She endorses good fetal movement. Urine culture and BV/yeast swab collected. Cervical exam unchanged. No blood noted on glove. Vagina appears mildly irritated with no discharge or lesions. Will f/u based on results. Reviewed labor precautions and when to return to the hospital.  Lloyd Huger, CNM

## 2022-05-06 NOTE — Telephone Encounter (Signed)
Called patient in reference to her concerns to get her scheduled to be seen in office today. No answer left VM with patient to call the office.

## 2022-05-07 ENCOUNTER — Encounter: Payer: Self-pay | Admitting: Obstetrics and Gynecology

## 2022-05-07 ENCOUNTER — Encounter: Payer: Self-pay | Admitting: Certified Nurse Midwife

## 2022-05-07 ENCOUNTER — Observation Stay
Admission: EM | Admit: 2022-05-07 | Discharge: 2022-05-07 | Disposition: A | Discharge status: Home / Self Care | Payer: BC Managed Care – PPO | Source: Home / Self Care | Admitting: Certified Nurse Midwife

## 2022-05-07 ENCOUNTER — Other Ambulatory Visit: Payer: Self-pay

## 2022-05-07 DIAGNOSIS — O418X30 Other specified disorders of amniotic fluid and membranes, third trimester, not applicable or unspecified: Secondary | ICD-10-CM | POA: Insufficient documentation

## 2022-05-07 DIAGNOSIS — Z3A38 38 weeks gestation of pregnancy: Secondary | ICD-10-CM | POA: Insufficient documentation

## 2022-05-07 DIAGNOSIS — O471 False labor at or after 37 completed weeks of gestation: Secondary | ICD-10-CM | POA: Insufficient documentation

## 2022-05-07 DIAGNOSIS — O26893 Other specified pregnancy related conditions, third trimester: Secondary | ICD-10-CM | POA: Diagnosis not present

## 2022-05-07 DIAGNOSIS — R109 Unspecified abdominal pain: Secondary | ICD-10-CM | POA: Diagnosis not present

## 2022-05-07 LAB — RUPTURE OF MEMBRANE (ROM)PLUS: Rom Plus: NEGATIVE

## 2022-05-07 NOTE — OB Triage Note (Signed)
Discharge instructions reviewed and pt verbalized understanding. Pt stable at the time of discharge. Follow up care and red flag labor symptoms reviewed.

## 2022-05-07 NOTE — OB Triage Note (Signed)
Pt is a G2P1 and [redacted]w[redacted]d presenting to L&D with c/o leaking of fluid that started at 10am today. Pt states both occurred after using the bathroom and the first gush was clear fluid while the second gush was pale yellow in color. Pt reports occasional ctx every 5-10 min and rates pain 5/10 on a 0-10 pain scale. Pt reports positive fetal movement. Pt reports "spotting" that appears like mucous in consistency. VSS. Monitors applied and assessing. Report given to [facility].

## 2022-05-07 NOTE — OB Triage Note (Signed)
    L&D OB Triage Note  SUBJECTIVE Stephanie Walker is a 26 y.o. G2P1001 female at [redacted]w[redacted]d, EDD Estimated Date of Delivery: 05/17/22 who presented to triage with complaints of leaking fluid and irregular contractions.   OB History  Gravida Para Term Preterm AB Living  2 1 1  0 0 1  SAB IAB Ectopic Multiple Live Births  0 0 0 0 1    # Outcome Date GA Lbr Len/2nd Weight Sex Delivery Anes PTL Lv  2 Current           1 Term 06/17/18 [redacted]w[redacted]d 06:09 / 00:17 3410 g F Vag-Spont EPI  LIV     Name: Stephanie Walker,Stephanie Walker     Apgar1: 8  Apgar5: 10    Medications Prior to Admission  Medication Sig Dispense Refill Last Dose   Prenatal Vit-Fe Fumarate-FA (PRENATAL PO) Take by mouth.   05/07/2022     OBJECTIVE  Nursing Evaluation:   BP 129/74 (BP Location: Right Arm)   Pulse 80   Temp 98.5 F (36.9 C) (Oral)   Resp 16   Ht 5\' 2"  (1.575 m)   Wt 78 kg   LMP 08/10/2021 (Approximate)   BMI 31.46 kg/m    Findings:   ROM plus negative      NST was performed and has been reviewed by me.  NST INTERPRETATION: Category I  Mode: External Baseline Rate (A): 135 bpm Variability: Moderate Accelerations: 15 x 15 Decelerations: None     Contraction Frequency (min): 1 ctx with UI  ASSESSMENT Impression:  1.  Pregnancy:  G2P1001 at [redacted]w[redacted]d , EDD Estimated Date of Delivery: 05/17/22 2.  Reassuring fetal and maternal status 3.  Intact , not in labor   PLAN 1. Current condition and above findings reviewed.  Reassuring fetal and maternal condition. 2. Discharge home with standard labor precautions given to return to L&D or call the office for problems. 3. Continue routine prenatal care.  [redacted]w[redacted]d, CNM

## 2022-05-09 ENCOUNTER — Inpatient Hospital Stay
Admission: EM | Admit: 2022-05-09 | Discharge: 2022-05-10 | DRG: 807 | Disposition: A | Discharge status: Home / Self Care | Payer: BC Managed Care – PPO | Attending: Certified Nurse Midwife | Admitting: Certified Nurse Midwife

## 2022-05-09 ENCOUNTER — Encounter: Payer: Self-pay | Admitting: Obstetrics and Gynecology

## 2022-05-09 ENCOUNTER — Other Ambulatory Visit: Payer: Self-pay

## 2022-05-09 DIAGNOSIS — Z23 Encounter for immunization: Secondary | ICD-10-CM | POA: Diagnosis not present

## 2022-05-09 DIAGNOSIS — Z3A38 38 weeks gestation of pregnancy: Secondary | ICD-10-CM | POA: Diagnosis not present

## 2022-05-09 DIAGNOSIS — O26893 Other specified pregnancy related conditions, third trimester: Secondary | ICD-10-CM | POA: Diagnosis present

## 2022-05-09 LAB — TYPE AND SCREEN
ABO/RH(D): A POS
Antibody Screen: NEGATIVE

## 2022-05-09 LAB — CBC
HCT: 38.7 % (ref 36.0–46.0)
Hemoglobin: 12.9 g/dL (ref 12.0–15.0)
MCH: 29.5 pg (ref 26.0–34.0)
MCHC: 33.3 g/dL (ref 30.0–36.0)
MCV: 88.6 fL (ref 80.0–100.0)
Platelets: 163 10*3/uL (ref 150–400)
RBC: 4.37 MIL/uL (ref 3.87–5.11)
RDW: 13.3 % (ref 11.5–15.5)
WBC: 14.5 10*3/uL — ABNORMAL HIGH (ref 4.0–10.5)
nRBC: 0 % (ref 0.0–0.2)

## 2022-05-09 LAB — URINE CULTURE

## 2022-05-09 MED ORDER — SIMETHICONE 80 MG PO CHEW: 80.0000 mg | CHEWABLE_TABLET | ORAL | Status: DC | PRN

## 2022-05-09 MED ORDER — SOD CITRATE-CITRIC ACID 500-334 MG/5ML PO SOLN: 30.0000 mL | ORAL | Status: DC | PRN

## 2022-05-09 MED ORDER — BUTORPHANOL TARTRATE 2 MG/ML IJ SOLN: 1.0000 mg | INTRAMUSCULAR | Status: DC | PRN

## 2022-05-09 MED ORDER — COCONUT OIL OIL: 1.0000 "application " | TOPICAL_OIL | Status: DC | PRN

## 2022-05-09 MED ORDER — ONDANSETRON HCL 4 MG/2ML IJ SOLN: 4.0000 mg | Freq: Four times a day (QID) | INTRAMUSCULAR | Status: DC | PRN

## 2022-05-09 MED ORDER — DOCUSATE SODIUM 100 MG PO CAPS
100.0000 mg | ORAL_CAPSULE | Freq: Two times a day (BID) | ORAL | Status: DC
Start: 2022-05-10 — End: 2022-05-11

## 2022-05-09 MED ORDER — PRENATAL MULTIVITAMIN CH
1.0000 | ORAL_TABLET | Freq: Every day | ORAL | Status: DC
  Administered 2022-05-09 – 2022-05-10 (×2): 1 via ORAL
  Filled 2022-05-09 (×2): qty 1

## 2022-05-09 MED ORDER — OXYTOCIN BOLUS FROM INFUSION
333.0000 mL | Freq: Once | INTRAVENOUS | Status: AC
  Administered 2022-05-09: 333 mL via INTRAVENOUS

## 2022-05-09 MED ORDER — ACETAMINOPHEN 325 MG PO TABS
650.0000 mg | ORAL_TABLET | ORAL | Status: DC | PRN
  Administered 2022-05-09: 650 mg via ORAL
  Filled 2022-05-09: qty 2

## 2022-05-09 MED ORDER — METHYLERGONOVINE MALEATE 0.2 MG PO TABS: 0.2000 mg | ORAL_TABLET | ORAL | Status: DC | PRN

## 2022-05-09 MED ORDER — BENZOCAINE-MENTHOL 20-0.5 % EX AERO: 1.0000 "application " | INHALATION_SPRAY | CUTANEOUS | Status: DC | PRN

## 2022-05-09 MED ORDER — LIDOCAINE HCL (PF) 1 % IJ SOLN: 30.0000 mL | INTRAMUSCULAR | Status: DC | PRN

## 2022-05-09 MED ORDER — WITCH HAZEL-GLYCERIN EX PADS: 1.0000 "application " | MEDICATED_PAD | CUTANEOUS | Status: DC | PRN

## 2022-05-09 MED ORDER — LACTATED RINGERS IV SOLN: 500.0000 mL | INTRAVENOUS | Status: DC | PRN

## 2022-05-09 MED ORDER — OXYCODONE-ACETAMINOPHEN 5-325 MG PO TABS: 1.0000 | ORAL_TABLET | ORAL | Status: DC | PRN

## 2022-05-09 MED ORDER — LACTATED RINGERS IV SOLN: INTRAVENOUS | Status: DC

## 2022-05-09 MED ORDER — METHYLERGONOVINE MALEATE 0.2 MG/ML IJ SOLN: 0.2000 mg | INTRAMUSCULAR | Status: DC | PRN

## 2022-05-09 MED ORDER — SENNOSIDES-DOCUSATE SODIUM 8.6-50 MG PO TABS
2.0000 | ORAL_TABLET | Freq: Every day | ORAL | Status: DC
  Administered 2022-05-09 – 2022-05-10 (×2): 2 via ORAL
  Filled 2022-05-09 (×2): qty 2

## 2022-05-09 MED ORDER — ONDANSETRON HCL 4 MG/2ML IJ SOLN: 4.0000 mg | INTRAMUSCULAR | Status: DC | PRN

## 2022-05-09 MED ORDER — ACETAMINOPHEN 325 MG PO TABS: 650.0000 mg | ORAL_TABLET | ORAL | Status: DC | PRN

## 2022-05-09 MED ORDER — OXYTOCIN-SODIUM CHLORIDE 30-0.9 UT/500ML-% IV SOLN
2.5000 [IU]/h | INTRAVENOUS | Status: DC
  Filled 2022-05-09: qty 500

## 2022-05-09 MED ORDER — ONDANSETRON HCL 4 MG PO TABS: 4.0000 mg | ORAL_TABLET | ORAL | Status: DC | PRN

## 2022-05-09 MED ORDER — FERROUS SULFATE 325 (65 FE) MG PO TABS
325.0000 mg | ORAL_TABLET | Freq: Every day | ORAL | Status: DC
Start: 2022-05-10 — End: 2022-05-11
  Administered 2022-05-10: 325 mg via ORAL
  Filled 2022-05-09: qty 1

## 2022-05-09 MED ORDER — OXYCODONE-ACETAMINOPHEN 5-325 MG PO TABS: 2.0000 | ORAL_TABLET | ORAL | Status: DC | PRN

## 2022-05-09 MED ORDER — DIBUCAINE (PERIANAL) 1 % EX OINT: 1.0000 "application " | TOPICAL_OINTMENT | CUTANEOUS | Status: DC | PRN

## 2022-05-09 MED ORDER — IBUPROFEN 600 MG PO TABS
600.0000 mg | ORAL_TABLET | Freq: Four times a day (QID) | ORAL | Status: DC
  Administered 2022-05-09 – 2022-05-10 (×5): 600 mg via ORAL
  Filled 2022-05-09 (×6): qty 1

## 2022-05-09 MED ORDER — MEASLES, MUMPS & RUBELLA VAC IJ SOLR
0.5000 mL | Freq: Once | INTRAMUSCULAR | Status: AC
  Administered 2022-05-10: 0.5 mL via SUBCUTANEOUS
  Filled 2022-05-09 (×2): qty 0.5

## 2022-05-09 NOTE — OB Triage Note (Signed)
Pt presents c/o ctx that started around 4am this morning. Pt states ctx got way worse around 7am. She reports going for a walk around 9 am and that her water broke. PT reports clear leaking of fluid. Pt also reports some vaginal bleeding. Positive fetal movement. VSS. Will continue to monitor.

## 2022-05-09 NOTE — H&P (Signed)
History and Physical   HPI  Stephanie Walker is a 26 y.o. G2P1001 at [redacted]w[redacted]d Estimated Date of Delivery: 05/17/22 who is being admitted for labor management.   OB History  OB History  Gravida Para Term Preterm AB Living  2 1 1  0 0 1  SAB IAB Ectopic Multiple Live Births  0 0 0 0 1    # Outcome Date GA Lbr Len/2nd Weight Sex Delivery Anes PTL Lv  2 Current           1 Term 06/17/18 [redacted]w[redacted]d 06:09 / 00:17 3410 g F Vag-Spont EPI  LIV     Name: Rigel,GIRL Lavonne     Apgar1: 8  Apgar5: 10    PROBLEM LIST  Pregnancy complications or risks: Patient Active Problem List   Diagnosis Date Noted   Labor and delivery, indication for care 05/04/2022   [redacted] weeks gestation of pregnancy    Supervision of other normal pregnancy, antepartum 06/13/2018    Prenatal labs and studies: ABO, Rh: --/--/PENDING (05/29 1129) Antibody: PENDING (05/29 1129) Rubella: <0.90 (11/10 1401) RPR: Non Reactive (03/15 0946)  HBsAg: Negative (11/10 1401)  HIV: Non Reactive (11/10 1401)  01-24-2005-- (05/12 0420)   Past Medical History:  Diagnosis Date   Acne    Oligomenorrhea      Past Surgical History:  Procedure Laterality Date   NO PAST SURGERIES       Medications    Current Discharge Medication List     CONTINUE these medications which have NOT CHANGED   Details  Prenatal Vit-Fe Fumarate-FA (PRENATAL PO) Take by mouth.         Allergies  Patient has no known allergies.  Review of Systems  Constitutional: negative Eyes: negative Ears, nose, mouth, throat, and face: negative Respiratory: negative Cardiovascular: negative Gastrointestinal: negative Genitourinary:negative Integument/breast: negative Hematologic/lymphatic: negative Musculoskeletal:negative Neurological: negative Behavioral/Psych: negative Endocrine: negative Allergic/Immunologic: negative  Physical Exam  BP 127/85 (BP Location: Left Arm)   Pulse 85   Temp 98.4 F (36.9 C) (Oral)   Resp 16    Ht 5\' 2"  (1.575 m)   Wt 78.5 kg   LMP 08/10/2021 (Approximate)   SpO2 100%   BMI 31.64 kg/m   Lungs:  CTA B Cardio: RRR without M/R/G Abd: Soft, gravid, NT Presentation: cephalic EXT: No C/C/ 1+ Edema DTRs: 2+ B CERVIX: Dilation: 10 Dilation Complete Date: 05/09/22 Dilation Complete Time: 1143 Station: Plus 2 Exam by:: BN RNC  See Prenatal records for more detailed PE.     FHR:  Baseline: 130 bpm, Variability: Good {> 6 bpm), Accelerations: Reactive, and Decelerations: Absent  Toco: Uterine Contractions: Frequency: Every 3-5 minutes  Test Results  Results for orders placed or performed during the hospital encounter of 05/09/22 (from the past 24 hour(s))  Type and screen [facility]     Status: None (Preliminary result)   Collection Time: 05/09/22 11:29 AM  Result Value Ref Range   ABO/RH(D) PENDING    Antibody Screen PENDING    Sample Expiration      05/12/2022,2359 Performed at [facility], [address] [place], [address] [place]   CBC     Status: Abnormal   Collection Time: 05/09/22 11:29 AM  Result Value Ref Range   WBC 14.5 (H) 4.0 - 10.5 K/uL   RBC 4.37 3.87 - 5.11 MIL/uL   Hemoglobin 12.9 12.0 - 15.0 g/dL   HCT 91791 05/11/22 - 50.5 %   MCV 88.6 80.0 -  100.0 fL   MCH 29.5 26.0 - 34.0 pg   MCHC 33.3 30.0 - 36.0 g/dL   RDW 27.2 53.6 - 64.4 %   Platelets 163 150 - 400 K/uL   nRBC 0.0 0.0 - 0.2 %   Group B Strep negative  Assessment   G2P1001 at [redacted]w[redacted]d Estimated Date of Delivery: 05/17/22  The fetus is reassuring.   Patient Active Problem List   Diagnosis Date Noted   Labor and delivery, indication for care 05/04/2022   [redacted] weeks gestation of pregnancy    Supervision of other normal pregnancy, antepartum 06/13/2018    Plan  1. Admit to L&D :   2. EFM:-- Category 1 3. Stadol or Epidural if desired.   4. Admission labs  5. Dr. Feliberto Gottron notified of admission  Doreene Burke, [place]  05/09/2022 12:05 PM

## 2022-05-09 NOTE — Lactation Note (Signed)
This note was copied from a baby's chart. Lactation Consultation Note  Patient Name: Stephanie Walker [id number]'L Date: 05/09/2022 Reason for consult: L&D Initial assessment;Mother's request;Early term 37-38.6wks;Breastfeeding assistance;RN request Age:27 hours  Maternal Data This is mom's 2nd baby. Vaginal precipitous delivery. Mom is an experienced breastfeeding mother, breastfed 1st baby for 4 months. Per mother's report at 4 months baby was vomiting after breastfeeding and she was advised to discontinue breastfeeding.  Has patient been taught Hand Expression?: Yes Does the patient have breastfeeding experience prior to this delivery?: Yes How long did the patient breastfeed?: 4 months  Feeding Mother's Current Feeding Choice: Breast Milk Assisted mom with positioning baby at her right breast. Baby latched readily with excellent latch. Audible swallows were noted, mom could identify swallowing.  LATCH Score Latch: Grasps breast easily, tongue down, lips flanged, rhythmical sucking.  Audible Swallowing: Spontaneous and intermittent  Type of Nipple: Everted at rest and after stimulation  Comfort (Breast/Nipple): Soft / non-tender  Hold (Positioning): Assistance needed to correctly position infant at breast and maintain latch. (Mom needed some slight assistance to position baby in cradle hold.)  LATCH Score: 9   Interventions Interventions: Breast feeding basics reviewed;Adjust position;Support pillows;Education Reviewed with mom how to know the baby is getting enough, feed baby with cues at least 8-12 times/24 hours, normative breastfeeding behaviors in first 24 hours or life and 24-72 hours of life. Mom verbalized understanding of information provided.  Discharge Pump: Personal (Mom has electric pump for home use.)  Consult Status Consult Status: Follow-up from L&D  Update provided to care nurse.  Fuller Song 05/09/2022, 12:39 PM

## 2022-05-10 LAB — CERVICOVAGINAL ANCILLARY ONLY
Bacterial Vaginitis (gardnerella): NEGATIVE
Candida Glabrata: NEGATIVE
Candida Vaginitis: NEGATIVE
Comment: NEGATIVE
Comment: NEGATIVE
Comment: NEGATIVE

## 2022-05-10 LAB — CBC
HCT: 34.6 % — ABNORMAL LOW (ref 36.0–46.0)
Hemoglobin: 11.4 g/dL — ABNORMAL LOW (ref 12.0–15.0)
MCH: 29.5 pg (ref 26.0–34.0)
MCHC: 32.9 g/dL (ref 30.0–36.0)
MCV: 89.4 fL (ref 80.0–100.0)
Platelets: 142 10*3/uL — ABNORMAL LOW (ref 150–400)
RBC: 3.87 MIL/uL (ref 3.87–5.11)
RDW: 13.6 % (ref 11.5–15.5)
WBC: 12.5 10*3/uL — ABNORMAL HIGH (ref 4.0–10.5)
nRBC: 0 % (ref 0.0–0.2)

## 2022-05-10 LAB — RPR: RPR Ser Ql: NONREACTIVE

## 2022-05-10 MED ORDER — IBUPROFEN 600 MG PO TABS: 600.0000 mg | ORAL_TABLET | Freq: Four times a day (QID) | ORAL | 0 refills | Status: AC

## 2022-05-10 NOTE — Final Progress Note (Signed)
Discharge Day SOAP Note:  Progress Note - Vaginal Delivery  Stephanie Walker is a 26 y.o. G2P1001 now PP day 1 s/p Vaginal, Spontaneous . Delivery was uncomplicated  Subjective  The patient has the following complaints: has no unusual complaints  Pain is controlled with current medications.   Patient is urinating without difficulty.  She is ambulating well.     Objective  Vital signs: BP 109/77 (BP Location: Left Arm)   Pulse 78   Temp 98.2 F (36.8 C) (Oral)   Resp 18   Ht 5\' 2"  (1.575 m)   Wt 78.5 kg   LMP 08/10/2021 (Approximate)   SpO2 99%   BMI 31.64 kg/m   Physical Exam: Gen: NAD Fundus Fundal Tone: Firm  Lochia Amount: Scant        Data Review Labs: Lab Results  Component Value Date   WBC 12.5 (H) 05/10/2022   HGB 11.4 (L) 05/10/2022   HCT 34.6 (L) 05/10/2022   MCV 89.4 05/10/2022   PLT 142 (L) 05/10/2022      Latest Ref Rng & Units 05/10/2022    5:56 AM 05/09/2022   11:29 AM 04/22/2022    4:10 PM  CBC  WBC 4.0 - 10.5 K/uL 12.5   14.5   9.8    Hemoglobin 12.0 - 15.0 g/dL 06/22/2022   34.3   73.5    Hematocrit 36.0 - 46.0 % 34.6   38.7   34.2    Platelets 150 - 400 K/uL 142   163   167     A POS  Edinburgh Score:    05/09/2022    7:16 PM  Edinburgh Postnatal Depression Scale Screening Tool  I have been able to laugh and see the funny side of things. 0  I have looked forward with enjoyment to things. 0  I have blamed myself unnecessarily when things went wrong. 1  I have been anxious or worried for no good reason. 0  I have felt scared or panicky for no good reason. 0  Things have been getting on top of me. 0  I have been so unhappy that I have had difficulty sleeping. 0  I have felt sad or miserable. 0  I have been so unhappy that I have been crying. 0  The thought of harming myself has occurred to me. 0  Edinburgh Postnatal Depression Scale Total 1    Assessment/Plan  Active Problems:   Labor and delivery, indication for care    Plan for  discharge today.  Discharge Instructions: Per After Visit Summary. Activity: Advance as tolerated. Pelvic rest for 6 weeks.  Also refer to After Visit Summary Diet: Regular Medications: Allergies as of 05/10/2022   No Known Allergies      Medication List     TAKE these medications    ibuprofen 600 MG tablet Commonly known as: ADVIL Take 1 tablet (600 mg total) by mouth every 6 (six) hours.   PRENATAL PO Take by mouth.       Outpatient follow up: 2 wks video visit & 6 wk ppv with 05/12/2022   Postpartum contraception:Nexplanon  Discharged Condition: good  Discharged to: home  Newborn Data: Disposition:home with mother  Apgars: APGAR (1 MIN):  8   APGAR (5 MINS):  9   APGAR (10 MINS):     Baby Feeding: Breast    [name], CNM  05/10/2022 7:45 AM

## 2022-05-10 NOTE — Lactation Note (Signed)
This note was copied from a baby's chart. Lactation Consultation Note  Patient Name: Boy Annitta Fifield MEQAS'T Date: 05/10/2022 Reason for consult: Follow-up assessment;Early term 37-38.6wks Age:27 hours  Maternal Data Has patient been taught Hand Expression?: Yes Does the patient have breastfeeding experience prior to this delivery?: Yes How long did the patient breastfeed?: 4 months Mom reports that first child stopped gaining weight at 3 months; had to have feeding tube. Mom desires to BF longer with this baby.  Feeding Mother's Current Feeding Choice: Breast Milk  LATCH Score   Did not feed during this visit.   Lactation Tools Discussed/Used    Interventions Interventions: Breast feeding basics reviewed;Hand express;Pre-pump if needed;Breast compression;DEBP;Hand pump;Ice;Education;Pace feeding  Discussed tips and strategies to build supply and maintain supply. Introducing pumping post feeds to build supply, reviewed paced bottle feeding- but encouraged the delay of artifical nipples to protect the latch.  Discharge Discharge Education: Engorgement and breast care;Warning signs for feeding baby;Outpatient recommendation Pump: Personal  Encouraged breast softening, possible pre pumping prior to latching. Tips given for management of breast fullness/engorgement.  Consult Status Consult Status: Complete  Outpatient lactation services given. Encouraged to call with questions or for ongoing support.  Danford Bad 05/10/2022, 10:52 AM

## 2022-05-10 NOTE — Discharge Summary (Signed)
Patient Name: Stephanie Walker DOB: Sep 09, 1996 MRN: [id number]                            Discharge Summary  Date of Admission: 05/09/2022 Date of Discharge: 05/10/2022 Delivering Provider: Doreene Burke   Admitting Diagnosis: Labor and delivery, indication for care [O75.9] at [redacted]w[redacted]d Secondary diagnosis:  Active Problems:   Labor and delivery, indication for care  Mode of Delivery: normal spontaneous vaginal delivery              Discharge diagnosis: Term Pregnancy Delivered      Intrapartum Procedures:  none   Post partum procedures:  none  Complications: none                     Discharge Day SOAP Note:  Progress Note - Vaginal Delivery  Stephanie Walker is a 26 y.o. G2P1001 now PP day 1 s/p Vaginal, Spontaneous . Delivery was uncomplicated  Subjective  The patient has the following complaints: has no unusual complaints  Pain is controlled with current medications.   Patient is urinating without difficulty.  She is ambulating well.     Objective  Vital signs: BP 109/77 (BP Location: Left Arm)   Pulse 78   Temp 98.2 F (36.8 C) (Oral)   Resp 18   Ht 5\' 2"  (1.575 m)   Wt 78.5 kg   LMP 08/10/2021 (Approximate)   SpO2 99%   BMI 31.64 kg/m   Physical Exam: Gen: NAD Fundus Fundal Tone: Firm  Lochia Amount: Scant        Data Review Labs: Lab Results  Component Value Date   WBC 12.5 (H) 05/10/2022   HGB 11.4 (L) 05/10/2022   HCT 34.6 (L) 05/10/2022   MCV 89.4 05/10/2022   PLT 142 (L) 05/10/2022      Latest Ref Rng & Units 05/10/2022    5:56 AM 05/09/2022   11:29 AM 04/22/2022    4:10 PM  CBC  WBC 4.0 - 10.5 K/uL 12.5   14.5   9.8    Hemoglobin 12.0 - 15.0 g/dL 06/22/2022   56.3   14.9    Hematocrit 36.0 - 46.0 % 34.6   38.7   34.2    Platelets 150 - 400 K/uL 142   163   167     A POS  Edinburgh Score:    05/09/2022    7:16 PM  Edinburgh Postnatal Depression Scale Screening Tool  I have been able to laugh and see the funny side of things. 0  I  have looked forward with enjoyment to things. 0  I have blamed myself unnecessarily when things went wrong. 1  I have been anxious or worried for no good reason. 0  I have felt scared or panicky for no good reason. 0  Things have been getting on top of me. 0  I have been so unhappy that I have had difficulty sleeping. 0  I have felt sad or miserable. 0  I have been so unhappy that I have been crying. 0  The thought of harming myself has occurred to me. 0  Edinburgh Postnatal Depression Scale Total 1    Assessment/Plan  Active Problems:   Labor and delivery, indication for care    Plan for discharge today.  Discharge Instructions: Per After Visit Summary. Activity: Advance as tolerated. Pelvic rest for 6 weeks.  Also refer to After Visit Summary  Diet: Regular Medications: Allergies as of 05/10/2022   No Known Allergies      Medication List     TAKE these medications    ibuprofen 600 MG tablet Commonly known as: ADVIL Take 1 tablet (600 mg total) by mouth every 6 (six) hours.   PRENATAL PO Take by mouth.       Outpatient follow up: 2 wks video visit & 6 wk ppv with [name]   Postpartum contraception:Nexplanon  Discharged Condition: good  Discharged to: home  Newborn Data: Disposition:home with mother  Apgars: APGAR (1 MIN):  8   APGAR (5 MINS):  9   APGAR (10 MINS):     Baby Feeding: Breast    Doreene Burke, CNM  05/10/2022 7:45 AM

## 2022-05-10 NOTE — Discharge Summary (Signed)
Pt dc instructions and follow up appt reviewed with pt again.was previously reviewed on day shift. Pt verbalizes understanding of all dc instructions. Prescriptions sent to pharmacy. SL dc'd. Pt dc'd to home via wc with infant in arms.

## 2022-05-10 NOTE — Discharge Instructions (Signed)

## 2022-05-13 ENCOUNTER — Encounter: Payer: BC Managed Care – PPO | Admitting: Obstetrics

## 2022-05-24 ENCOUNTER — Telehealth: Payer: BC Managed Care – PPO | Admitting: Certified Nurse Midwife

## 2022-05-24 ENCOUNTER — Telehealth (INDEPENDENT_AMBULATORY_CARE_PROVIDER_SITE_OTHER): Payer: BC Managed Care – PPO | Admitting: Certified Nurse Midwife

## 2022-05-24 ENCOUNTER — Encounter: Payer: Self-pay | Admitting: Certified Nurse Midwife

## 2022-05-24 DIAGNOSIS — Z1331 Encounter for screening for depression: Secondary | ICD-10-CM

## 2022-05-24 DIAGNOSIS — Z1332 Encounter for screening for maternal depression: Secondary | ICD-10-CM

## 2022-05-24 NOTE — Progress Notes (Signed)
Virtual Visit via Video Note  I connected with Stephanie Walker on 05/24/22 at  9:00 AM EDT by a video enabled telemedicine application and verified that I am speaking with the correct person using two identifiers.  Location: Patient: at home Provider: at office   I discussed the limitations of evaluation and management by telemedicine and the availability of in person appointments. The patient expressed understanding and agreed to proceed.  History of Present Illness: G2P2 2 weeks postpartum , SVD on 05/09/22   Observations/Objective: Doing well, no pain , bleeding has declined, infant sleeping well. Getting up 2 x at night to eat. Pt is pumping and feeding in bottle. She denies any issues with milk supply.    Assessment and Plan:  EDPS: 1   No signs of postpartum depression at this time. Follow Up Instructions: 4 weeks for in office postpartum check.    I discussed the assessment and treatment plan with the patient. The patient was provided an opportunity to ask questions and all were answered. The patient agreed with the plan and demonstrated an understanding of the instructions.   The patient was advised to call back or seek an in-person evaluation if the symptoms worsen or if the condition fails to improve as anticipated.  I provided 7 minutes of non-face-to-face time during this encounter.   Philip Aspen, CNM

## 2022-06-18 ENCOUNTER — Encounter: Payer: Self-pay | Admitting: Certified Nurse Midwife

## 2022-06-21 ENCOUNTER — Encounter: Payer: Self-pay | Admitting: Certified Nurse Midwife

## 2022-06-21 ENCOUNTER — Ambulatory Visit (INDEPENDENT_AMBULATORY_CARE_PROVIDER_SITE_OTHER): Payer: BC Managed Care – PPO | Admitting: Certified Nurse Midwife

## 2022-06-21 NOTE — Progress Notes (Signed)
Subjective:    Stephanie Walker is a 26 y.o. G42P1001 Caucasian female who presents for a postpartum visit. She is 6 weeks postpartum following a spontaneous vaginal delivery at 38.6 gestational weeks. Anesthesia: none. I have fully reviewed the prenatal and intrapartum course. Postpartum course has been norma. Baby's course has been norma. Baby is feeding by breast. Bleeding no bleeding. Bowel function is normal. Bladder function is normal. Patient is not sexually active. Last sexual activity: prior to delivery. Contraception method is Nexplanon. Postpartum depression screening: negative. Score 0.  Last pap 11/03/21 and was negative.  The following portions of the patient's history were reviewed and updated as appropriate: allergies, current medications, past medical history, past surgical history and problem list.  Review of Systems Pertinent items are noted in HPI.   Vitals:   06/21/22 0821  BP: 117/74  Pulse: 76  Weight: 144 lb 4.8 oz (65.5 kg)  Height: 5\' 2"  (1.575 m)   No LMP recorded.  Objective:   General:  alert, cooperative and no distress   Breasts:  deferred, no complaints  Lungs: clear to auscultation bilaterally  Heart:  regular rate and rhythm  Abdomen: soft, nontender   Vulva: normal  Vagina: normal vagina  Cervix:  closed  Corpus: Well-involuted  Adnexa:  Non-palpable  Rectal Exam: no hemorrhoids        Assessment:   Postpartum exam 6 wks s/p SVD Breastfeeding Depression screening Contraception counseling   Plan:  : Nexplanon, return for placement as soon as able Follow up in: 2 days for nexplanon or earlier if needed  , CNM

## 2022-06-28 ENCOUNTER — Telehealth: Payer: Self-pay | Admitting: Certified Nurse Midwife

## 2022-06-29 ENCOUNTER — Encounter: Payer: Self-pay | Admitting: Obstetrics and Gynecology

## 2022-06-29 ENCOUNTER — Ambulatory Visit (INDEPENDENT_AMBULATORY_CARE_PROVIDER_SITE_OTHER): Payer: BC Managed Care – PPO | Admitting: Obstetrics and Gynecology

## 2022-06-29 VITALS — BP 108/61 | HR 72 | Resp 16 | Ht 62.0 in | Wt 143.1 lb

## 2022-06-29 DIAGNOSIS — O9270 Unspecified disorders of lactation: Secondary | ICD-10-CM | POA: Diagnosis not present

## 2022-06-29 NOTE — Progress Notes (Signed)
    GYNECOLOGY PROGRESS NOTE  Subjective:    Patient ID: Stephanie Walker, female    DOB: Jun 26, 1996, 26 y.o.   MRN: [id number]  HPI  Patient is a 26 y.o. G34P1001 female who presents for complaints of clogged milk duct. Breast hurting, sore to move her arm. Had a fever yesterday 102 fever, took Tyleonol and resolved.  Notes that she continues to pump regularly, however less out (went from 5 oz to 1 oz) right breast. Notes that she has tried hand expressing, pumping, warm compresses, showers, etc.   The following portions of the patient's history were reviewed and updated as appropriate: allergies, current medications, past family history, past medical history, past social history, past surgical history, and problem list.  Review of Systems Pertinent items noted in HPI and remainder of comprehensive ROS otherwise negative.   Objective:   Blood pressure 108/61, pulse 72, resp. rate 16, weight 143 lb 1.6 oz (64.9 kg), SpO2 100 %, currently breastfeeding. Body mass index is 26.17 kg/m. General appearance: alert and no distress Abdomen: soft, non-tender; bowel sounds normal; no masses,  no organomegaly Breasts: lactating, no erythema, nipples normal bilaterally. Tenderness in right breast in upper outer quadrant with firm duct palpable, ~ 1 cm away from areola at 10 o'clock.     Assessment:   1. Lactation problem      Plan:   - Patient has tried all recommended management options. Will refer to Lactation today for consult. Patient is able to be seen today.    Hildred Laser, MD [facility]

## 2022-06-30 ENCOUNTER — Encounter: Payer: Self-pay | Admitting: Obstetrics and Gynecology

## 2022-06-30 MED ORDER — DICLOXACILLIN SODIUM 500 MG PO CAPS: 500.0000 mg | ORAL_CAPSULE | Freq: Four times a day (QID) | ORAL | 0 refills | Status: AC

## 2022-07-11 ENCOUNTER — Ambulatory Visit (INDEPENDENT_AMBULATORY_CARE_PROVIDER_SITE_OTHER): Payer: BC Managed Care – PPO | Admitting: Certified Nurse Midwife

## 2022-07-11 ENCOUNTER — Encounter: Payer: Self-pay | Admitting: Certified Nurse Midwife

## 2022-07-11 VITALS — BP 126/81 | HR 92 | Ht 62.0 in | Wt 142.6 lb

## 2022-07-11 DIAGNOSIS — Z3202 Encounter for pregnancy test, result negative: Secondary | ICD-10-CM | POA: Diagnosis not present

## 2022-07-11 DIAGNOSIS — Z30017 Encounter for initial prescription of implantable subdermal contraceptive: Secondary | ICD-10-CM

## 2022-07-11 LAB — POCT URINE PREGNANCY: Preg Test, Ur: NEGATIVE

## 2022-07-11 MED ORDER — ETONOGESTREL 68 MG ~~LOC~~ IMPL
68.0000 mg | DRUG_IMPLANT | Freq: Once | SUBCUTANEOUS | Status: AC
  Administered 2022-07-11: 68 mg via SUBCUTANEOUS

## 2022-07-11 NOTE — Patient Instructions (Signed)
Nexplanon Instructions After Insertion  Keep bandage clean and dry for 24 hours  May use ice/Tylenol/Ibuprofen for soreness or pain  If you develop fever, drainage or increased warmth from incision site-contact office immediately   

## 2022-07-11 NOTE — Progress Notes (Signed)
Stephanie Walker is a 26 y.o. year old Caucasian female here for Nexplanon insertion.  No LMP recorded (lmp unknown)., last sexual intercourse was several weeks ago, and her pregnancy test today was negative.  Risks/benefits/side effects of Nexplanon have been discussed and her questions have been answered.  Specifically, a failure rate of 12/998 has been reported, with an increased failure rate if pt takes St. John's Wort and/or antiseizure medicaitons.  Der Gagliano is aware of the common side effect of irregular bleeding, which the incidence of decreases over time.  BP 126/81   Pulse 92   Ht 5\' 2"  (1.575 m)   Wt 142 lb 9.6 oz (64.7 kg)   LMP  (LMP Unknown)   Breastfeeding Yes   BMI 26.08 kg/m   Results for orders placed or performed in visit on 07/11/22 (from the past 24 hour(s))  POCT urine pregnancy   Collection Time: 07/11/22 10:21 AM  Result Value Ref Range   Preg Test, Ur Negative Negative     She is right-handed, so her left arm, approximately 4 inches proximal from the elbow, was cleansed with alcohol and anesthetized with 2cc of 2% Lidocaine.  The area was cleansed again with betadine and the Nexplanon was inserted per manufacturer's recommendations without difficulty.  A steri-strip and pressure bandage were applied.  Pt was instructed to keep the area clean and dry, remove pressure bandage in 24 hours, and keep insertion site covered with the steri-strip for 3-5 days.  Back up contraception was recommended for 2 weeks.  She was given a card indicating date Nexplanon was inserted and date it needs to be removed. Follow-up PRN problems.  07/13/22 Willoughby Doell,CNM

## 2023-06-06 ENCOUNTER — Ambulatory Visit (INDEPENDENT_AMBULATORY_CARE_PROVIDER_SITE_OTHER): Payer: BC Managed Care – PPO

## 2023-06-06 ENCOUNTER — Ambulatory Visit
Admission: EM | Admit: 2023-06-06 | Discharge: 2023-06-06 | Disposition: A | Discharge status: Home / Self Care | Payer: BC Managed Care – PPO | Attending: Emergency Medicine | Admitting: Emergency Medicine

## 2023-06-06 DIAGNOSIS — M25511 Pain in right shoulder: Secondary | ICD-10-CM

## 2023-06-06 NOTE — Discharge Instructions (Signed)
Take Tylenol or ibuprofen as directed.  Rest your shoulder.  Apply ice packs 2-3 times a day for up to 20 minutes each.  Wear the sling as needed for comfort.    Follow up with an orthopedist such as the one listed below.
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
